# Patient Record
Sex: Female | Born: 1995 | Race: White | Hispanic: No | Marital: Married | Smoking: Never smoker
Health system: Southern US, Community
[De-identification: ages and names within clinical notes are randomized; demographics above are authoritative.]

## PROBLEM LIST (undated history)

## (undated) DIAGNOSIS — N390 Urinary tract infection, site not specified: Secondary | ICD-10-CM

## (undated) HISTORY — DX: Urinary tract infection, site not specified: N39.0

## (undated) HISTORY — PX: SPINE SURGERY: SHX786

## (undated) HISTORY — PX: OTHER SURGICAL HISTORY: SHX169

---

## 2017-09-11 ENCOUNTER — Encounter: Payer: Self-pay | Admitting: Adult Health

## 2017-10-09 ENCOUNTER — Other Ambulatory Visit: Payer: Self-pay | Admitting: Adult Health

## 2017-11-10 ENCOUNTER — Encounter: Payer: Self-pay | Admitting: Adult Health

## 2017-11-10 ENCOUNTER — Ambulatory Visit (INDEPENDENT_AMBULATORY_CARE_PROVIDER_SITE_OTHER): Payer: PRIVATE HEALTH INSURANCE | Admitting: Adult Health

## 2017-11-10 ENCOUNTER — Other Ambulatory Visit (HOSPITAL_COMMUNITY)
Admission: RE | Admit: 2017-11-10 | Discharge: 2017-11-10 | Disposition: A | Discharge status: Home / Self Care | Payer: PRIVATE HEALTH INSURANCE | Source: Ambulatory Visit | Attending: Adult Health | Admitting: Adult Health

## 2017-11-10 VITALS — BP 129/78 | HR 71 | Ht 65.0 in | Wt 156.0 lb

## 2017-11-10 DIAGNOSIS — Z01419 Encounter for gynecological examination (general) (routine) without abnormal findings: Secondary | ICD-10-CM | POA: Diagnosis not present

## 2017-11-10 NOTE — Progress Notes (Signed)
Patient ID: Angel Diaz, female   DOB: 07-Aug-1995, 22 y.o.   MRN: [id number] History of Present Illness: Angel Diaz is a 22 year old white female, married, G0P0, in for well woman gyn exam and first pap.She works at [employer].  PCP is Dr Reuel Boom.    Current Medications, Allergies, Past Medical History, Past Surgical History, Family History and Social History were reviewed in [employer] record.     Review of Systems:  Patient denies any headaches, hearing loss, fatigue, blurred vision, shortness of breath, chest pain, abdominal pain, problems with bowel movements, urination, or intercourse. No joint pain or mood swings. Periods are good, and she uses condoms, and is good with those.  Physical Exam:BP 129/78 (BP Location: Left Arm, Patient Position: Sitting, Cuff Size: Normal)   Pulse 71   Ht 5\' 5"  (1.651 m)   Wt 156 lb (70.8 kg)   LMP 10/17/2017   BMI 25.96 kg/m  General:  Well developed, well nourished, no acute distress Skin:  Warm and dry Neck:  Midline trachea, normal thyroid, good ROM, no lymphadenopathy Lungs; Clear to auscultation bilaterally Breast:  No dominant palpable mass, retraction, or nipple discharge Cardiovascular: Regular rate and rhythm Abdomen:  Soft, non tender, no hepatosplenomegaly Pelvic:  External genitalia is normal in appearance, no lesions.  The vagina is normal in appearance. Urethra has no lesions or masses. The cervix is nulliparous, pap with GC/CHL performed.  Uterus is felt to be normal size, shape, and contour.  No adnexal masses or tenderness noted.Bladder is non tender, no masses felt. Extremities/musculoskeletal:  No swelling or varicosities noted, no clubbing or cyanosis Psych:  No mood changes, alert and cooperative,seems happy PHQ 2 score 0. Examination chaperoned by Malachy Mood LPN.  Impression: 1. Well woman exam with routine gynecological exam       Plan: Take folic acid 1 mg daily  Physical in 1 year Pap in  3 if normal Get fasting labs as baseline at 24.

## 2017-11-12 LAB — CYTOLOGY - PAP
Chlamydia: NEGATIVE
DIAGNOSIS: NEGATIVE
Neisseria Gonorrhea: NEGATIVE

## 2019-05-04 ENCOUNTER — Other Ambulatory Visit (HOSPITAL_COMMUNITY): Payer: Self-pay | Admitting: Family Medicine

## 2019-05-04 ENCOUNTER — Other Ambulatory Visit: Payer: Self-pay | Admitting: Family Medicine

## 2019-05-04 DIAGNOSIS — R319 Hematuria, unspecified: Secondary | ICD-10-CM

## 2019-05-04 DIAGNOSIS — R109 Unspecified abdominal pain: Secondary | ICD-10-CM

## 2019-05-10 ENCOUNTER — Other Ambulatory Visit: Payer: Self-pay

## 2019-05-10 ENCOUNTER — Ambulatory Visit (HOSPITAL_COMMUNITY)
Admission: RE | Admit: 2019-05-10 | Discharge: 2019-05-10 | Disposition: A | Discharge status: Home / Self Care | Payer: Commercial Managed Care - PPO | Source: Ambulatory Visit | Attending: Family Medicine | Admitting: Family Medicine

## 2019-05-10 DIAGNOSIS — R109 Unspecified abdominal pain: Secondary | ICD-10-CM | POA: Diagnosis present

## 2019-05-10 DIAGNOSIS — R319 Hematuria, unspecified: Secondary | ICD-10-CM | POA: Insufficient documentation

## 2019-06-09 ENCOUNTER — Encounter: Payer: Self-pay | Admitting: Adult Health

## 2019-06-09 ENCOUNTER — Ambulatory Visit (INDEPENDENT_AMBULATORY_CARE_PROVIDER_SITE_OTHER): Payer: Commercial Managed Care - PPO | Admitting: Adult Health

## 2019-06-09 VITALS — BP 116/79 | HR 83 | Ht 65.0 in | Wt 162.5 lb

## 2019-06-09 DIAGNOSIS — N6324 Unspecified lump in the left breast, lower inner quadrant: Secondary | ICD-10-CM | POA: Insufficient documentation

## 2019-06-09 DIAGNOSIS — Z3202 Encounter for pregnancy test, result negative: Secondary | ICD-10-CM | POA: Diagnosis not present

## 2019-06-09 DIAGNOSIS — N926 Irregular menstruation, unspecified: Secondary | ICD-10-CM | POA: Insufficient documentation

## 2019-06-09 DIAGNOSIS — N644 Mastodynia: Secondary | ICD-10-CM | POA: Insufficient documentation

## 2019-06-09 LAB — POCT URINE PREGNANCY: Preg Test, Ur: NEGATIVE

## 2019-06-09 NOTE — Progress Notes (Signed)
  Subjective:     Patient ID: Angel Diaz, female   DOB: 12-09-1995, 24 y.o.   MRN: [id number]  HPI Angel Diaz is a 24 year old white female,married, G0P0, in complaining of pain in left breast and has a spot.  She had physical with PCP last year, and still works at [employer].  PCP is Dr Reuel Boom.  Review of Systems Has left breast tenderness for about 2-3 months and now noticed skin change Periods irregular and has missed one.  Reviewed past medical,surgical, social and family history. Reviewed medications and allergies.     Objective:   Physical Exam BP 116/79 (BP Location: Left Arm, Patient Position: Sitting, Cuff Size: Normal)   Pulse 83   Ht 5\' 5"  (1.651 m)   Wt 162 lb 8 oz (73.7 kg)   LMP 04/19/2019 (Approximate)   BMI 27.04 kg/m UPT negative Fall risk is low  Skin warm and dry,  Breasts:no dominate palpable mass, retraction or nipple discharge on the right, on the left, no retraction or nipple discharge, but has 1 cm tender mass at 8 o'clock about 2 FBs from areola, and has skin change near areola.    Assessment:     1. Pregnancy examination or test, negative result  2. Breast pain, left Will get left breast 04/21/2019 6/1 at 11:40 am at [facility] Decrease caffeine  3. Mass of lower inner quadrant of left breast Will get left breat [facility] at [facility] 06/29/19 at 11:40 am  4. Irregular periods Try to keep track of and time sex,would like to be pregnant, if wants progesterone level let me know  5. Missed period UPT negative    Plan:     Follow up prn

## 2019-06-10 ENCOUNTER — Other Ambulatory Visit: Payer: Self-pay

## 2019-06-10 ENCOUNTER — Encounter (HOSPITAL_COMMUNITY): Payer: Self-pay | Admitting: Emergency Medicine

## 2019-06-10 ENCOUNTER — Emergency Department (HOSPITAL_COMMUNITY)
Admission: EM | Admit: 2019-06-10 | Discharge: 2019-06-10 | Disposition: A | Discharge status: Home / Self Care | Payer: Commercial Managed Care - PPO | Attending: Emergency Medicine | Admitting: Emergency Medicine

## 2019-06-10 DIAGNOSIS — N644 Mastodynia: Secondary | ICD-10-CM | POA: Diagnosis not present

## 2019-06-10 DIAGNOSIS — N63 Unspecified lump in unspecified breast: Secondary | ICD-10-CM | POA: Insufficient documentation

## 2019-06-10 MED ORDER — NAPROXEN 250 MG PO TABS
500.0000 mg | ORAL_TABLET | Freq: Once | ORAL | Status: AC
  Administered 2019-06-10: 500 mg via ORAL
  Filled 2019-06-10: qty 2

## 2019-06-10 NOTE — ED Triage Notes (Signed)
Patient states she was diagnosed with a cyst on her left breast yesterday.  Patient states her breast is hurting and she hasn't been able to sleep.

## 2019-06-10 NOTE — ED Provider Notes (Signed)
[facility] EMERGENCY DEPARTMENT Provider Note   CSN: [id number] Arrival date & time: 06/10/19  3299     History No chief complaint on file.   Angel Diaz is a 24 y.o. female with no significant past medical history presenting for reevaluation of left breast pain and mass.  [facility] and has been scheduled for a breast ultrasound for June 1.  She describes a several month history of left breast pain which is becoming more uncomfortable in association with a tender nodule and discoloration to the skin near the site of the nodule.  She describes a dark circle which is more prominent in low lighting and disappears in bright light at the site.  She describes pain with movement, certain positions and when her bra rubs against the site as well.  She denies nipple discharge, also denies fevers or chills.  She is not pregnant as confirmed by a urine pregnancy test completed yesterday at [facility].  She has not taking any pain relievers for her symptoms.  She is desirous of getting her ultrasound completed sooner than June 1.  HPI     Past Medical History:  Diagnosis Date  . UTI (urinary tract infection)     Patient Active Problem List   Diagnosis Date Noted  . Missed period 06/09/2019  . Irregular periods 06/09/2019  . Mass of lower inner quadrant of left breast 06/09/2019  . Breast pain, left 06/09/2019  . Pregnancy examination or test, negative result 06/09/2019    Past Surgical History:  Procedure Laterality Date  . 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 Diabetes Mother     Social History   Tobacco Use  . Smoking status: Never Smoker  . Smokeless tobacco: Never Used    Substance Use Topics  . Alcohol use: Yes    Comment: rarely  . Drug use: Never    Home Medications Prior to Admission medications   Not on File    Allergies    Sulfa antibiotics  Review of Systems   Review of Systems  Constitutional: Negative for chills and fever.  HENT: Negative.   Eyes: Negative.   Respiratory: Negative.   Cardiovascular: Negative.           Gastrointestinal: Negative.   Genitourinary: Negative.   Musculoskeletal: Negative.   Skin: Negative.  Negative for rash and wound.  Neurological: Negative.   Psychiatric/Behavioral: Negative.     Physical Exam Updated Vital Signs BP (!) 141/97 (BP Location: Left Arm)   Pulse 92   Temp 98.4 F (36.9 C) (Oral)   Resp 18   Ht 5\' 5"  (1.651 m)   Wt 72.6 kg   SpO2 97%   BMI 26.63 kg/m   Physical Exam Vitals and nursing note reviewed.  Constitutional:      Appearance: She is well-developed.  HENT:     Head: Normocephalic and atraumatic.  Eyes:  Conjunctiva/sclera: Conjunctivae normal.  Cardiovascular:     Rate and Rhythm: Normal rate.  Pulmonary:     Effort: Pulmonary effort is normal.  Chest:     Breasts:        Left: Mass and tenderness present. No swelling, bleeding, inverted nipple or nipple discharge.     Comments: Small mobile, tender nodule 8 oclock position medial to left areola.  Subtle area of darker pigmentation to skin between the nodule and the areola.  No erythema, no skin inflammation or erythema.  Abdominal:     General: Bowel sounds are normal.     Palpations: Abdomen is soft.     Tenderness: There is no abdominal tenderness.  Musculoskeletal:        General: Normal range of motion.     Cervical back: Neck supple.  Skin:    General: Skin is warm and dry.  Neurological:     Mental Status: She is alert.     ED Results / Procedures / Treatments   Labs (all labs ordered are listed, but only abnormal results are displayed) Labs Reviewed - No data to  display  EKG None  Radiology No results found.  Procedures Procedures (including critical care time)  Medications Ordered in ED Medications  naproxen (NAPROSYN) tablet 500 mg (500 mg Oral Given 06/10/19 6834)    ED Course  I have reviewed the triage vital signs and the nursing notes.  Pertinent labs & imaging results that were available during my care of the patient were reviewed by me and considered in my medical decision making (see chart for details).    MDM Rules/Calculators/A&P                      Pt advised anti inflammatory pain med.  She has naproxen 500 mg tablets, advised starting use of this, heat tx may also help with discomfort.  Suggested calling radiology to get on cancellation list to get her [place] moved up if possible,  No red flag signs for infection or cancer on exam today, but I suspect getting the [place] sooner will help ease patients concern and worry.   Final Clinical Impression(s) / ED Diagnoses Final diagnoses:  Breast pain  Breast mass in female    Rx / DC Orders ED Discharge Orders    None       Victoriano Lain 06/10/19 1962    Linwood Dibbles, MD 06/11/19 [id number]

## 2019-06-10 NOTE — Discharge Instructions (Addendum)
As discussed I recommend taking your naproxen 500 mg twice daily to help with your breast discomfort.  A gentle heating pad 20 minutes several times daily may also help reduce discomfort.  You may want to try a different type of bra which does not rub against the area of discomfort.  I also recommend calling the ultrasound department to see if they can move up your appointment, perhaps they have had a cancellation and you can have this procedure sooner.  I do not detect any worrisome findings but agree with your need of an ultrasound to confirm this.  Having this test sooner will help ease your concerns.

## 2019-06-11 ENCOUNTER — Other Ambulatory Visit: Payer: Self-pay

## 2019-06-11 ENCOUNTER — Ambulatory Visit
Admission: RE | Admit: 2019-06-11 | Discharge: 2019-06-11 | Disposition: A | Discharge status: Home / Self Care | Payer: Commercial Managed Care - PPO | Source: Ambulatory Visit | Attending: Adult Health | Admitting: Adult Health

## 2019-06-11 DIAGNOSIS — N6324 Unspecified lump in the left breast, lower inner quadrant: Secondary | ICD-10-CM

## 2019-06-11 DIAGNOSIS — N644 Mastodynia: Secondary | ICD-10-CM

## 2019-06-29 ENCOUNTER — Other Ambulatory Visit (HOSPITAL_COMMUNITY): Payer: Commercial Managed Care - PPO

## 2019-08-18 ENCOUNTER — Telehealth: Payer: Self-pay | Admitting: *Deleted

## 2019-08-18 ENCOUNTER — Telehealth: Payer: Self-pay | Admitting: Obstetrics & Gynecology

## 2019-08-18 NOTE — Telephone Encounter (Signed)
Telephoned patient at home number. Patient states had positive pregnancy test Monday and is scheduled an appointment for 7/26. Patient is not experiencing any symptoms of an ectopic pregnancy. Advised patient if starts having severe left or right lower quadrant pain or bleeding would need to be evaluated at ER. Patient voiced understanding.

## 2019-08-18 NOTE — Telephone Encounter (Signed)
Patient is concerned she has ectopic pregnancy

## 2019-08-23 ENCOUNTER — Encounter: Payer: Self-pay | Admitting: *Deleted

## 2019-08-23 ENCOUNTER — Ambulatory Visit (INDEPENDENT_AMBULATORY_CARE_PROVIDER_SITE_OTHER): Payer: Commercial Managed Care - PPO | Admitting: *Deleted

## 2019-08-23 VITALS — BP 119/75 | HR 84 | Ht 65.0 in | Wt 166.0 lb

## 2019-08-23 DIAGNOSIS — Z3201 Encounter for pregnancy test, result positive: Secondary | ICD-10-CM | POA: Diagnosis not present

## 2019-08-23 LAB — POCT URINE PREGNANCY: Preg Test, Ur: POSITIVE — AB

## 2019-08-23 NOTE — Progress Notes (Signed)
   NURSE VISIT- PREGNANCY CONFIRMATION   SUBJECTIVE:  Angel Diaz is a 24 y.o. G1P0000 female at [redacted]w[redacted]d by certain LMP of Patient's last menstrual period was 07/18/2019. Here for pregnancy confirmation.  Home pregnancy test: positive x 8  She reports nausea.  She is taking prenatal vitamins.    OBJECTIVE:  BP 119/75 (BP Location: Right Arm, Patient Position: Sitting, Cuff Size: Normal)   Pulse 84   Ht 5\' 5"  (1.651 m)   Wt 166 lb (75.3 kg)   LMP 07/18/2019   BMI 27.62 kg/m   Appears well, in no apparent distress OB History  Gravida Para Term Preterm AB Living  1 0 0 0 0 0  SAB TAB Ectopic Multiple Live Births  0 0 0 0 0    # Outcome Date GA Lbr Len/2nd Weight Sex Delivery Anes PTL Lv  1 Current             Results for orders placed or performed in visit on 08/23/19 (from the past 24 hour(s))  POCT urine pregnancy   Collection Time: 08/23/19  4:10 PM  Result Value Ref Range   Preg Test, Ur Positive (A) Negative    ASSESSMENT: Positive pregnancy test, [redacted]w[redacted]d by LMP    PLAN: Schedule for dating ultrasound in 3 weeks Prenatal vitamins: continue   Nausea medicines: not currently needed   OB packet given: Yes  [redacted]w[redacted]d  08/23/2019 4:21 PM

## 2019-08-23 NOTE — Progress Notes (Signed)
Chart reviewed for nurse visit. Agree with plan of care.  Adline Potter, NP 08/23/2019 4:24 PM

## 2019-08-26 ENCOUNTER — Telehealth: Payer: Self-pay | Admitting: *Deleted

## 2019-08-26 ENCOUNTER — Telehealth: Payer: Self-pay | Admitting: Advanced Practice Midwife

## 2019-08-26 NOTE — Telephone Encounter (Signed)
Patient states she has noticed some dark spotting when wiping after using the bathroom and very mild cramping.  Denies recent intercourse.  Discussed with Victorino Dike, NP and informed patient that she is a little to early for a dating ultrasound.  Advised to continue to monitor bleeding and cramping and if bleeding became bright red or heavier and/or cramping increased, to give our office a call.  Encouraged to push fluids, avoid lifting over 25 pounds and refrain from intercourse for 7 days after bleeding stops.    Pt also states she is experiencing nausea.  Informed medications are very limited in the first trimester.  Advised to try vitamin B6 and Unisom.  Pt agreeable to plan and will let us know if she needs anything further.

## 2019-08-26 NOTE — Telephone Encounter (Signed)
Pt is 5 weeks / 5 days having concern she would like to discuss

## 2019-09-08 ENCOUNTER — Other Ambulatory Visit: Payer: Self-pay | Admitting: Obstetrics and Gynecology

## 2019-09-08 DIAGNOSIS — O3680X0 Pregnancy with inconclusive fetal viability, not applicable or unspecified: Secondary | ICD-10-CM

## 2019-09-13 ENCOUNTER — Other Ambulatory Visit: Payer: Self-pay

## 2019-09-13 ENCOUNTER — Ambulatory Visit (INDEPENDENT_AMBULATORY_CARE_PROVIDER_SITE_OTHER): Payer: Commercial Managed Care - PPO

## 2019-09-13 DIAGNOSIS — O3680X0 Pregnancy with inconclusive fetal viability, not applicable or unspecified: Secondary | ICD-10-CM

## 2019-09-13 DIAGNOSIS — Z3A08 8 weeks gestation of pregnancy: Secondary | ICD-10-CM

## 2019-09-13 NOTE — Progress Notes (Signed)
[place] 8+1 wks,single IUP w/ys,positive fht 176 bpm,normal ovaries,crl 22.60 mm

## 2019-10-06 ENCOUNTER — Other Ambulatory Visit: Payer: Self-pay | Admitting: Obstetrics & Gynecology

## 2019-10-06 DIAGNOSIS — Z3682 Encounter for antenatal screening for nuchal translucency: Secondary | ICD-10-CM

## 2019-10-06 DIAGNOSIS — Z3400 Encounter for supervision of normal first pregnancy, unspecified trimester: Secondary | ICD-10-CM | POA: Insufficient documentation

## 2019-10-07 ENCOUNTER — Encounter: Payer: Self-pay | Admitting: Advanced Practice Midwife

## 2019-10-07 ENCOUNTER — Ambulatory Visit (INDEPENDENT_AMBULATORY_CARE_PROVIDER_SITE_OTHER): Payer: 59 | Admitting: Advanced Practice Midwife

## 2019-10-07 ENCOUNTER — Ambulatory Visit: Payer: 59 | Admitting: *Deleted

## 2019-10-07 ENCOUNTER — Ambulatory Visit (INDEPENDENT_AMBULATORY_CARE_PROVIDER_SITE_OTHER): Payer: 59

## 2019-10-07 VITALS — BP 114/78 | HR 80 | Wt 169.5 lb

## 2019-10-07 DIAGNOSIS — Z3401 Encounter for supervision of normal first pregnancy, first trimester: Secondary | ICD-10-CM

## 2019-10-07 DIAGNOSIS — Z331 Pregnant state, incidental: Secondary | ICD-10-CM | POA: Diagnosis not present

## 2019-10-07 DIAGNOSIS — Z3682 Encounter for antenatal screening for nuchal translucency: Secondary | ICD-10-CM

## 2019-10-07 DIAGNOSIS — Z1389 Encounter for screening for other disorder: Secondary | ICD-10-CM

## 2019-10-07 DIAGNOSIS — Z3A11 11 weeks gestation of pregnancy: Secondary | ICD-10-CM | POA: Diagnosis not present

## 2019-10-07 LAB — POCT URINALYSIS DIPSTICK OB
Blood, UA: NEGATIVE
Glucose, UA: NEGATIVE
Ketones, UA: NEGATIVE
Leukocytes, UA: NEGATIVE
Nitrite, UA: NEGATIVE
POC,PROTEIN,UA: NEGATIVE

## 2019-10-07 NOTE — Progress Notes (Signed)
[place] 11+4 wks,measurements c/w dates,CRL 54.92 mm,fhr 160 bpm,anterior placenta gr 0,NB present,NT 1.2 mm

## 2019-10-07 NOTE — Progress Notes (Signed)
INITIAL OBSTETRICAL VISIT Patient name: Angel Diaz MRN [id number]  Date of birth: 12-16-1995 Chief Complaint:   Initial Prenatal Visit (nt/it)  History of Present Illness:   Angel Diaz is a 24 y.o. G1P0000  female at [redacted]w[redacted]d by LMP c/w u/s at 8 weeks with an Estimated Date of Delivery: 04/23/20 being seen today for her initial obstetrical visit.   Her obstetrical history is significant for first baby.   Today she reports some nausea, improving. Declines meds .  Depression screen [facility] 2/9 10/07/2019 11/10/2017  Decreased Interest 2 0  Down, Depressed, Hopeless 1 0  PHQ - 2 Score 3 0  Altered sleeping 0 -  Tired, decreased energy 2 -  Change in appetite 1 -  Feeling bad or failure about yourself  0 -  Trouble concentrating 1 -  Moving slowly or fidgety/restless 0 -  Suicidal thoughts 0 -  PHQ-9 Score 7 -    Patient's last menstrual period was 07/18/2019. Last pap 10/19. Results were: normal Review of Systems:   Pertinent items are noted in HPI Denies cramping/contractions, leakage of fluid, vaginal bleeding, abnormal vaginal discharge w/ itching/odor/irritation, headaches, visual changes, shortness of breath, chest pain, abdominal pain, severe nausea/vomiting, or problems with urination or bowel movements unless otherwise stated above.  Pertinent History Reviewed:  Reviewed past medical,surgical, social, obstetrical and family history.  Reviewed problem list, medications and allergies. OB History  Gravida Para Term Preterm AB Living  1 0 0 0 0 0  SAB TAB Ectopic Multiple Live Births  0 0 0 0 0    # Outcome Date GA Lbr Len/2nd Weight Sex Delivery Anes PTL Lv  1 Current            Physical Assessment:   Vitals:   10/07/19 1128  BP: 114/78  Pulse: 80  Weight: 169 lb 8 oz (76.9 kg)  Body mass index is 28.21 kg/m.       Physical Examination:  General appearance - well appearing, and in no distress  Mental status - alert, oriented to person, place, and  time  Psych:  She has a normal mood and affect  Skin - warm and dry, normal color, no suspicious lesions noted  Chest - effort normal  Heart - normal rate and regular rhythm  Abdomen - soft, nontender  Extremities:  No swelling or varicosities noted   TODAY'S NT [place] 11+4 wks,measurements c/w dates,CRL 54.92 mm,fhr 160 bpm,anterior placenta gr 0,NB present,NT 1.2 mm  Results for orders placed or performed in visit on 10/07/19 (from the past 24 hour(s))  POC Urinalysis Dipstick OB   Collection Time: 10/07/19 11:59 AM  Result Value Ref Range   Color, UA     Clarity, UA     Glucose, UA Negative Negative   Bilirubin, UA     Ketones, UA neg    Spec Grav, UA     Blood, UA neg    pH, UA     POC,PROTEIN,UA Negative Negative, Trace, Small (1+), Moderate (2+), Large (3+), 4+   Urobilinogen, UA     Nitrite, UA neg    Leukocytes, UA Negative Negative   Appearance     Odor      Assessment & Plan:  1) Low-Risk Pregnancy G1P0000 at [redacted]w[redacted]d with an Estimated Date of Delivery: 04/23/20   2) Initial OB visit   Meds: No orders of the defined types were placed in this encounter.   Initial labs obtained Continue prenatal vitamins Reviewed n/v relief  measures and warning s/s to report Reviewed recommended weight gain based on pre-gravid BMI Encouraged well-balanced diet Genetic & carrier screening discussed: requests Panorama, NT/IT and Horizon 14 , declines AFP Ultrasound discussed; fetal survey: requested CCNC completed> form faxed if has or is planning to apply for medicaid The nature of [facility] [employer] with multiple MDs and other Advanced Practice Providers was explained to patient; also emphasized that fellows, residents, and students are part of our team.        Jacklyn Shell 12:17 PM

## 2019-10-07 NOTE — Patient Instructions (Addendum)
[name], I greatly value your feedback.  If you receive a survey following your visit with [place] today, we appreciate you taking the time to fill it out.  Thanks, Cathie Beams, DNP, CNM  [facility] HAS MOVED!!! It is now [facility] ([address] [place], [place] [address]) Entrance located off of [address] Free 24/7 valet parking   Nausea & Vomiting  Have saltine crackers or pretzels by your bed and eat a few bites before you raise your head out of bed in the morning  Eat small frequent meals throughout the day instead of large meals  Drink plenty of fluids throughout the day to stay hydrated, just don't drink a lot of fluids with your meals.  This can make your stomach fill up faster making you feel sick  Do not brush your teeth right after you eat  Products with real ginger are good for nausea, like ginger ale and ginger hard candy Make sure it says made with real ginger!  Sucking on sour candy like lemon heads is also good for nausea  If your prenatal vitamins make you nauseated, take them at night so you will sleep through the nausea  Sea Bands  If you feel like you need medicine for the nausea & vomiting please let us know  If you are unable to keep any fluids or food down please let us know   Constipation  Drink plenty of fluid, preferably water, throughout the day  Eat foods high in fiber such as fruits, vegetables, and grains  Exercise, such as walking, is a good way to keep your bowels regular  Drink warm fluids, especially warm prune juice, or decaf coffee  Eat a 1/2 cup of real oatmeal (not instant), 1/2 cup applesauce, and 1/2-1 cup warm prune juice every day  If needed, you may take Colace (docusate sodium) stool softener once or twice a day to help keep the stool soft.   If you still are having problems with constipation, you may take Miralax once daily as needed to help keep your bowels regular.    Home Blood Pressure Monitoring for Patients   Your provider has recommended that you check your blood pressure (BP) at least once a week at home. If you do not have a blood pressure cuff at home, one will be provided for you. Contact your provider if you have not received your monitor within 1 week.   Helpful Tips for Accurate Home Blood Pressure Checks   Don't smoke, exercise, or drink caffeine 30 minutes before checking your BP  Use the restroom before checking your BP (a full bladder can raise your pressure)  Relax in a comfortable upright chair  Feet on the ground  Left arm resting comfortably on a flat surface at the level of your heart  Legs uncrossed  Back supported  Sit quietly and don't talk  Place the cuff on your bare arm  Adjust snuggly, so that only two fingertips can fit between your skin and the top of the cuff  Check 2 readings separated by at least one minute  Keep a log of your BP readings  For a visual, please reference this diagram: http://ccnc.care/bpdiagram  Provider Name: [facility]     Phone: [phone]  Zone 1: ALL CLEAR  Continue to monitor your symptoms:   BP reading is less than 140 (top number) or less than 90 (bottom number)   No right upper stomach  pain  No headaches or seeing spots  No feeling nauseated or throwing up  No swelling in face and hands  Zone 2: CAUTION Call your doctor's office for any of the following:   BP reading is greater than 140 (top number) or greater than 90 (bottom number)   Stomach pain under your ribs in the middle or right side  Headaches or seeing spots  Feeling nauseated or throwing up  Swelling in face and hands  Zone 3: EMERGENCY  Seek immediate medical care if you have any of the following:   BP reading is greater than160 (top number) or greater than 110 (bottom number)  Severe headaches not improving with Tylenol  Serious difficulty catching your breath  Any worsening  symptoms from Zone 2    First Trimester of Pregnancy The first trimester of pregnancy is from week 1 until the end of week 12 (months 1 through 3). A week after a sperm fertilizes an egg, the egg will implant on the wall of the uterus. This embryo will begin to develop into a baby. Genes from you and your partner are forming the baby. The female genes determine whether the baby is a boy or a girl. At 6-8 weeks, the eyes and face are formed, and the heartbeat can be seen on ultrasound. At the end of 12 weeks, all the baby's organs are formed.  Now that you are pregnant, you will want to do everything you can to have a healthy baby. Two of the most important things are to get good prenatal care and to follow your health care provider's instructions. Prenatal care is all the medical care you receive before the baby's birth. This care will help prevent, find, and treat any problems during the pregnancy and childbirth. BODY CHANGES Your body goes through many changes during pregnancy. The changes vary from woman to woman.   You may gain or lose a couple of pounds at first.  You may feel sick to your stomach (nauseous) and throw up (vomit). If the vomiting is uncontrollable, call your health care provider.  You may tire easily.  You may develop headaches that can be relieved by medicines approved by your health care provider.  You may urinate more often. Painful urination may mean you have a bladder infection.  You may develop heartburn as a result of your pregnancy.  You may develop constipation because certain hormones are causing the muscles that push waste through your intestines to slow down.  You may develop hemorrhoids or swollen, bulging veins (varicose veins).  Your breasts may begin to grow larger and become tender. Your nipples may stick out more, and the tissue that surrounds them (areola) may become darker.  Your gums may bleed and may be sensitive to brushing and flossing.  Dark  spots or blotches (chloasma, mask of pregnancy) may develop on your face. This will likely fade after the baby is born.  Your menstrual periods will stop.  You may have a loss of appetite.  You may develop cravings for certain kinds of food.  You may have changes in your emotions from day to day, such as being excited to be pregnant or being concerned that something may go wrong with the pregnancy and baby.  You may have more vivid and strange dreams.  You may have changes in your hair. These can include thickening of your hair, rapid growth, and changes in texture. Some women also have hair loss during or after pregnancy, or hair that  feels dry or thin. Your hair will most likely return to normal after your baby is born. WHAT TO EXPECT AT YOUR PRENATAL VISITS During a routine prenatal visit:  You will be weighed to make sure you and the baby are growing normally.  Your blood pressure will be taken.  Your abdomen will be measured to track your baby's growth.  The fetal heartbeat will be listened to starting around week 10 or 12 of your pregnancy.  Test results from any previous visits will be discussed. Your health care provider may ask you:  How you are feeling.  If you are feeling the baby move.  If you have had any abnormal symptoms, such as leaking fluid, bleeding, severe headaches, or abdominal cramping.  If you have any questions. Other tests that may be performed during your first trimester include:  Blood tests to find your blood type and to check for the presence of any previous infections. They will also be used to check for low iron levels (anemia) and Rh antibodies. Later in the pregnancy, blood tests for diabetes will be done along with other tests if problems develop.  Urine tests to check for infections, diabetes, or protein in the urine.  An ultrasound to confirm the proper growth and development of the baby.  An amniocentesis to check for possible genetic  problems.  Fetal screens for spina bifida and Down syndrome.  You may need other tests to make sure you and the baby are doing well. HOME CARE INSTRUCTIONS  Medicines  Follow your health care provider's instructions regarding medicine use. Specific medicines may be either safe or unsafe to take during pregnancy.  Take your prenatal vitamins as directed.  If you develop constipation, try taking a stool softener if your health care provider approves. Diet  Eat regular, well-balanced meals. Choose a variety of foods, such as meat or vegetable-based protein, fish, milk and low-fat dairy products, vegetables, fruits, and whole grain breads and cereals. Your health care provider will help you determine the amount of weight gain that is right for you.  Avoid raw meat and uncooked cheese. These carry germs that can cause birth defects in the baby.  Eating four or five small meals rather than three large meals a day may help relieve nausea and vomiting. If you start to feel nauseous, eating a few soda crackers can be helpful. Drinking liquids between meals instead of during meals also seems to help nausea and vomiting.  If you develop constipation, eat more high-fiber foods, such as fresh vegetables or fruit and whole grains. Drink enough fluids to keep your urine clear or pale yellow. Activity and Exercise  Exercise only as directed by your health care provider. Exercising will help you:  Control your weight.  Stay in shape.  Be prepared for labor and delivery.  Experiencing pain or cramping in the lower abdomen or low back is a good sign that you should stop exercising. Check with your health care provider before continuing normal exercises.  Try to avoid standing for long periods of time. Move your legs often if you must stand in one place for a long time.  Avoid heavy lifting.  Wear low-heeled shoes, and practice good posture.  You may continue to have sex unless your health care  provider directs you otherwise. Relief of Pain or Discomfort  Wear a good support bra for breast tenderness.    Take warm sitz baths to soothe any pain or discomfort caused by hemorrhoids. Use hemorrhoid cream  if your health care provider approves.    Rest with your legs elevated if you have leg cramps or low back pain.  If you develop varicose veins in your legs, wear support hose. Elevate your feet for 15 minutes, 3-4 times a day. Limit salt in your diet. Prenatal Care  Schedule your prenatal visits by the twelfth week of pregnancy. They are usually scheduled monthly at first, then more often in the last 2 months before delivery.  Write down your questions. Take them to your prenatal visits.  Keep all your prenatal visits as directed by your health care provider. Safety  Wear your seat belt at all times when driving.  Make a list of emergency phone numbers, including numbers for family, friends, the hospital, and police and fire departments. General Tips  Ask your health care provider for a referral to a local prenatal education class. Begin classes no later than at the beginning of month 6 of your pregnancy.  Ask for help if you have counseling or nutritional needs during pregnancy. Your health care provider can offer advice or refer you to specialists for help with various needs.  Do not use hot tubs, steam rooms, or saunas.  Do not douche or use tampons or scented sanitary pads.  Do not cross your legs for long periods of time.  Avoid cat litter boxes and soil used by cats. These carry germs that can cause birth defects in the baby and possibly loss of the fetus by miscarriage or stillbirth.  Avoid all smoking, herbs, alcohol, and medicines not prescribed by your health care provider. Chemicals in these affect the formation and growth of the baby.  Schedule a dentist appointment. At home, brush your teeth with a soft toothbrush and be gentle when you floss. SEEK MEDICAL  CARE IF:   You have dizziness.  You have mild pelvic cramps, pelvic pressure, or nagging pain in the abdominal area.  You have persistent nausea, vomiting, or diarrhea.  You have a bad smelling vaginal discharge.  You have pain with urination.  You notice increased swelling in your face, hands, legs, or ankles. SEEK IMMEDIATE MEDICAL CARE IF:   You have a fever.  You are leaking fluid from your vagina.  You have spotting or bleeding from your vagina.  You have severe abdominal cramping or pain.  You have rapid weight gain or loss.  You vomit blood or material that looks like coffee grounds.  You are exposed to [place] measles and have never had them.  You are exposed to fifth disease or chickenpox.  You develop a severe headache.  You have shortness of breath.  You have any kind of trauma, such as from a fall or a car accident. Document Released: 01/08/2001 Document Revised: 05/31/2013 Document Reviewed: 11/24/2012 [facility] Patient Information 2015 [place], [place]. This information is not intended to replace advice given to you by your health care provider. Make sure you discuss any questions you have with your health care provider.  Coronavirus (COVID-19) Are you at risk?  Are you at risk for the Coronavirus (COVID-19)?  To be considered HIGH RISK for Coronavirus (COVID-19), you have to meet the following criteria:   Traveled to [place], [place], [place], [place] or [place]; or in the [place] to [place], [place], [place], or [place]; and have fever, cough, and shortness of breath within the last 2 weeks of travel OR  Been in close contact with a person diagnosed with COVID-19 within the last 2  weeks and have fever, cough, and shortness of breath  IF YOU DO NOT MEET THESE CRITERIA, YOU ARE CONSIDERED LOW RISK FOR COVID-19.  What to do if you are HIGH RISK for COVID-19?   If you are having a medical emergency, call 911.  Seek medical care right away.  Before you go to a doctors office, urgent care or emergency department, call ahead and tell them about your recent travel, contact with someone diagnosed with COVID-19, and your symptoms. You should receive instructions from your physicians office regarding next steps of care.   When you arrive at healthcare provider, tell the healthcare staff immediately you have returned from visiting [place], [place], [place], [place] or [place]; or traveled in the [place] to [place], [place], [place], or [place]; in the last two weeks or you have been in close contact with a person diagnosed with COVID-19 in the last 2 weeks.    Tell the health care staff about your symptoms: fever, cough and shortness of breath.  After you have been seen by a medical provider, you will be either: o Tested for (COVID-19) and discharged home on quarantine except to seek medical care if symptoms worsen, and asked to  - Stay home and avoid contact with others until you get your results (4-5 days)  - Avoid travel on public transportation if possible (such as bus, train, or airplane) or o Sent to the Emergency Department by EMS for evaluation, COVID-19 testing, and possible admission depending on your condition and test results.  What to do if you are LOW RISK for COVID-19?  Reduce your risk of any infection by using the same precautions used for avoiding the common cold or flu:   Wash your hands often with soap and warm water for at least 20 seconds.  If soap and water are not readily available, use an alcohol-based hand sanitizer with at least 60% alcohol.   If coughing or sneezing, cover your mouth and nose by coughing or sneezing into the elbow areas of your shirt or coat, into a tissue or into your sleeve (not your hands).  Avoid shaking hands with others and consider head nods or verbal greetings only.  Avoid touching your eyes, nose, or mouth with unwashed hands.   Avoid close contact with people who are  sick.  Avoid places or events with large numbers of people in one location, like concerts or sporting events.  Carefully consider travel plans you have or are making.  If you are planning any travel outside or inside the [place], visit the [facility] webpage for the latest health notices.  If you have some symptoms but not all symptoms, continue to monitor at home and seek medical attention if your symptoms worsen.  If you are having a medical emergency, call 911.   ADDITIONAL HEALTHCARE OPTIONS FOR PATIENTS  [facility] / e-Visit: [email or web address]         [facility]: [phone]  [facility]: [phone]                   [facility]: [phone]     Safe Medications in Pregnancy   Acne: Benzoyl Peroxide Salicylic Acid  Backache/Headache: Tylenol: 2 regular strength every 4 hours OR              2 Extra strength every 6 hours  Colds/Coughs/Allergies: Benadryl (alcohol free) 25 mg every 6 hours as needed Breath right strips Claritin  Cepacol throat lozenges Chloraseptic throat spray Cold-Eeze- up to three times per day Cough drops, alcohol free Flonase (by prescription only) Guaifenesin Mucinex Robitussin DM (plain only, alcohol free) Saline nasal spray/drops Sudafed (pseudoephedrine) & Actifed ** use only after [redacted] weeks gestation and if you do not have high blood pressure Tylenol Vicks Vaporub Zinc lozenges Zyrtec   Constipation: Colace Ducolax suppositories Fleet enema Glycerin suppositories Metamucil Milk of magnesia Miralax Senokot Smooth move tea  Diarrhea: Kaopectate Imodium A-D  *NO pepto Bismol  Hemorrhoids: Anusol Anusol HC Preparation H Tucks  Indigestion: Tums Maalox Mylanta Zantac  Pepcid  Insomnia: Benadryl (alcohol free) 25mg  every 6 hours as needed Tylenol PM Unisom, no Gelcaps  Leg  Cramps: Tums MagGel  Nausea/Vomiting:  Bonine Dramamine Emetrol Ginger extract Sea bands Meclizine  Nausea medication to take during pregnancy:  Unisom (doxylamine succinate 25 mg tablets) Take one tablet daily at bedtime. If symptoms are not adequately controlled, the dose can be increased to a maximum recommended dose of two tablets daily (1/2 tablet in the morning, 1/2 tablet mid-afternoon and one at bedtime). Vitamin B6 100mg  tablets. Take one tablet twice a day (up to 200 mg per day).  Skin Rashes: Aveeno products Benadryl cream or 25mg  every 6 hours as needed Calamine Lotion 1% cortisone cream  Yeast infection: Gyne-lotrimin 7 Monistat 7   **If taking multiple medications, please check labels to avoid duplicating the same active ingredients **take medication as directed on the label ** Do not exceed 4000 mg of tylenol in 24 hours **Do not take medications that contain aspirin or ibuprofen

## 2019-10-08 LAB — PMP SCREEN PROFILE (10S), URINE
Amphetamine Scrn, Ur: NEGATIVE ng/mL
BARBITURATE SCREEN URINE: NEGATIVE ng/mL
BENZODIAZEPINE SCREEN, URINE: NEGATIVE ng/mL
CANNABINOIDS UR QL SCN: NEGATIVE ng/mL
Cocaine (Metab) Scrn, Ur: NEGATIVE ng/mL
Creatinine(Crt), U: 89.1 mg/dL (ref 20.0–300.0)
Methadone Screen, Urine: NEGATIVE ng/mL
OXYCODONE+OXYMORPHONE UR QL SCN: NEGATIVE ng/mL
Opiate Scrn, Ur: NEGATIVE ng/mL
Ph of Urine: 7.1 (ref 4.5–8.9)
Phencyclidine Qn, Ur: NEGATIVE ng/mL
Propoxyphene Scrn, Ur: NEGATIVE ng/mL

## 2019-10-09 LAB — INTEGRATED 1
Crown Rump Length: 54.9 mm
Gest. Age on Collection Date: 11.9 weeks
Maternal Age at EDD: 25.6 yr
Nuchal Translucency (NT): 1.2 mm
Number of Fetuses: 1
PAPP-A Value: 1208.6 ng/mL
Weight: 170 [lb_av]

## 2019-10-09 LAB — CBC/D/PLT+RPR+RH+ABO+RUB AB...
Antibody Screen: NEGATIVE
Basophils Absolute: 0.1 10*3/uL (ref 0.0–0.2)
Basos: 1 %
EOS (ABSOLUTE): 0.1 10*3/uL (ref 0.0–0.4)
Eos: 1 %
HCV Ab: 0.1 s/co ratio (ref 0.0–0.9)
HIV Screen 4th Generation wRfx: NONREACTIVE
Hematocrit: 40.2 % (ref 34.0–46.6)
Hemoglobin: 13.4 g/dL (ref 11.1–15.9)
Hepatitis B Surface Ag: NEGATIVE
Immature Grans (Abs): 0.1 10*3/uL (ref 0.0–0.1)
Immature Granulocytes: 1 %
Lymphocytes Absolute: 2 10*3/uL (ref 0.7–3.1)
Lymphs: 20 %
MCH: 29.8 pg (ref 26.6–33.0)
MCHC: 33.3 g/dL (ref 31.5–35.7)
MCV: 89 fL (ref 79–97)
Monocytes Absolute: 0.8 10*3/uL (ref 0.1–0.9)
Monocytes: 8 %
Neutrophils Absolute: 7 10*3/uL (ref 1.4–7.0)
Neutrophils: 69 %
Platelets: 341 10*3/uL (ref 150–450)
RBC: 4.5 x10E6/uL (ref 3.77–5.28)
RDW: 12.6 % (ref 11.7–15.4)
RPR Ser Ql: NONREACTIVE
Rh Factor: POSITIVE
Rubella Antibodies, IGG: 5.79 index (ref >0.99)
WBC: 10 10*3/uL (ref 3.4–10.8)

## 2019-10-09 LAB — GC/CHLAMYDIA PROBE AMP
Chlamydia trachomatis, NAA: NEGATIVE
Neisseria Gonorrhoeae by PCR: NEGATIVE

## 2019-10-09 LAB — URINE CULTURE: Organism ID, Bacteria: NO GROWTH

## 2019-10-09 LAB — HCV INTERPRETATION

## 2019-11-02 ENCOUNTER — Encounter: Payer: Self-pay | Admitting: *Deleted

## 2019-11-02 DIAGNOSIS — Z3402 Encounter for supervision of normal first pregnancy, second trimester: Secondary | ICD-10-CM

## 2019-11-04 ENCOUNTER — Encounter: Payer: Self-pay | Admitting: Advanced Practice Midwife

## 2019-11-04 ENCOUNTER — Ambulatory Visit (INDEPENDENT_AMBULATORY_CARE_PROVIDER_SITE_OTHER): Payer: 59 | Admitting: Advanced Practice Midwife

## 2019-11-04 ENCOUNTER — Other Ambulatory Visit: Payer: Self-pay

## 2019-11-04 VITALS — BP 117/71 | HR 83 | Wt 174.5 lb

## 2019-11-04 DIAGNOSIS — Z1379 Encounter for other screening for genetic and chromosomal anomalies: Secondary | ICD-10-CM

## 2019-11-04 DIAGNOSIS — Z363 Encounter for antenatal screening for malformations: Secondary | ICD-10-CM

## 2019-11-04 DIAGNOSIS — Z3A15 15 weeks gestation of pregnancy: Secondary | ICD-10-CM

## 2019-11-04 DIAGNOSIS — Z331 Pregnant state, incidental: Secondary | ICD-10-CM

## 2019-11-04 DIAGNOSIS — Z3402 Encounter for supervision of normal first pregnancy, second trimester: Secondary | ICD-10-CM

## 2019-11-04 DIAGNOSIS — Z1389 Encounter for screening for other disorder: Secondary | ICD-10-CM

## 2019-11-04 LAB — POCT URINALYSIS DIPSTICK OB
Glucose, UA: NEGATIVE
Ketones, UA: NEGATIVE
Leukocytes, UA: NEGATIVE
Nitrite, UA: NEGATIVE
POC,PROTEIN,UA: NEGATIVE

## 2019-11-04 NOTE — Patient Instructions (Signed)
Angel Diaz, I greatly value your feedback.  If you receive a survey following your visit with [place] today, we appreciate you taking the time to fill it out.  Thanks, Cathie Beams, CNM     [facility] HAS MOVED!!! It is now [facility] at [facility] ([address], [place] [address]) Entrance located off of [address] Free 24/7 valet parking   Go to [email or web address] to register for FREE online childbirth classes    Second Trimester of Pregnancy The second trimester is from week 14 through week 27 (months 4 through 6). The second trimester is often a time when you feel your best. Your body has adjusted to being pregnant, and you begin to feel better physically. Usually, morning sickness has lessened or quit completely, you may have more energy, and you may have an increase in appetite. The second trimester is also a time when the fetus is growing rapidly. At the end of the sixth month, the fetus is about 9 inches long and weighs about 1 pounds. You will likely begin to feel the baby move (quickening) between 16 and 20 weeks of pregnancy. Body changes during your second trimester Your body continues to go through many changes during your second trimester. The changes vary from woman to woman.  Your weight will continue to increase. You will notice your lower abdomen bulging out.  You may begin to get stretch marks on your hips, abdomen, and breasts.  You may develop headaches that can be relieved by medicines. The medicines should be approved by your health care provider.  You may urinate more often because the fetus is pressing on your bladder.  You may develop or continue to have heartburn as a result of your pregnancy.  You may develop constipation because certain hormones are causing the muscles that push waste through your intestines to slow down.  You may develop hemorrhoids or swollen, bulging veins (varicose veins).  You may  have back pain. This is caused by: ? Weight gain. ? Pregnancy hormones that are relaxing the joints in your pelvis. ? A shift in weight and the muscles that support your balance.  Your breasts will continue to grow and they will continue to become tender.  Your gums may bleed and may be sensitive to brushing and flossing.  Dark spots or blotches (chloasma, mask of pregnancy) may develop on your face. This will likely fade after the baby is born.  A dark line from your belly button to the pubic area (linea nigra) may appear. This will likely fade after the baby is born.  You may have changes in your hair. These can include thickening of your hair, rapid growth, and changes in texture. Some women also have hair loss during or after pregnancy, or hair that feels dry or thin. Your hair will most likely return to normal after your baby is born.  What to expect at prenatal visits During a routine prenatal visit:  You will be weighed to make sure you and the fetus are growing normally.  Your blood pressure will be taken.  Your abdomen will be measured to track your baby's growth.  The fetal heartbeat will be listened to.  Any test results from the previous visit will be discussed.  Your health care provider may ask you:  How you are feeling.  If you are feeling the baby move.  If you have had any abnormal symptoms, such as leaking fluid, bleeding, severe headaches, or  abdominal cramping.  If you are using any tobacco products, including cigarettes, chewing tobacco, and electronic cigarettes.  If you have any questions.  Other tests that may be performed during your second trimester include:  Blood tests that check for: ? Low iron levels (anemia). ? High blood sugar that affects pregnant women (gestational diabetes) between 40 and 28 weeks. ? Rh antibodies. This is to check for a protein on red blood cells (Rh factor).  Urine tests to check for infections, diabetes, or protein  in the urine.  An ultrasound to confirm the proper growth and development of the baby.  An amniocentesis to check for possible genetic problems.  Fetal screens for spina bifida and Down syndrome.  HIV (human immunodeficiency virus) testing. Routine prenatal testing includes screening for HIV, unless you choose not to have this test.  Follow these instructions at home: Medicines  Follow your health care provider's instructions regarding medicine use. Specific medicines may be either safe or unsafe to take during pregnancy.  Take a prenatal vitamin that contains at least 600 micrograms (mcg) of folic acid.  If you develop constipation, try taking a stool softener if your health care provider approves. Eating and drinking  Eat a balanced diet that includes fresh fruits and vegetables, whole grains, good sources of protein such as meat, eggs, or tofu, and low-fat dairy. Your health care provider will help you determine the amount of weight gain that is right for you.  Avoid raw meat and uncooked cheese. These carry germs that can cause birth defects in the baby.  If you have low calcium intake from food, talk to your health care provider about whether you should take a daily calcium supplement.  Limit foods that are high in fat and processed sugars, such as fried and sweet foods.  To prevent constipation: ? Drink enough fluid to keep your urine clear or pale yellow. ? Eat foods that are high in fiber, such as fresh fruits and vegetables, whole grains, and beans. Activity  Exercise only as directed by your health care provider. Most women can continue their usual exercise routine during pregnancy. Try to exercise for 30 minutes at least 5 days a week. Stop exercising if you experience uterine contractions.  Avoid heavy lifting, wear low heel shoes, and practice good posture.  A sexual relationship may be continued unless your health care provider directs you otherwise. Relieving pain  and discomfort  Wear a good support bra to prevent discomfort from breast tenderness.  Take warm sitz baths to soothe any pain or discomfort caused by hemorrhoids. Use hemorrhoid cream if your health care provider approves.  Rest with your legs elevated if you have leg cramps or low back pain.  If you develop varicose veins, wear support hose. Elevate your feet for 15 minutes, 3-4 times a day. Limit salt in your diet. Prenatal Care  Write down your questions. Take them to your prenatal visits.  Keep all your prenatal visits as told by your health care provider. This is important. Safety  Wear your seat belt at all times when driving.  Make a list of emergency phone numbers, including numbers for family, friends, the hospital, and police and fire departments. General instructions  Ask your health care provider for a referral to a local prenatal education class. Begin classes no later than the beginning of month 6 of your pregnancy.  Ask for help if you have counseling or nutritional needs during pregnancy. Your health care provider can offer advice or  refer you to specialists for help with various needs.  Do not use hot tubs, steam rooms, or saunas.  Do not douche or use tampons or scented sanitary pads.  Do not cross your legs for long periods of time.  Avoid cat litter boxes and soil used by cats. These carry germs that can cause birth defects in the baby and possibly loss of the fetus by miscarriage or stillbirth.  Avoid all smoking, herbs, alcohol, and unprescribed drugs. Chemicals in these products can affect the formation and growth of the baby.  Do not use any products that contain nicotine or tobacco, such as cigarettes and e-cigarettes. If you need help quitting, ask your health care provider.  Visit your dentist if you have not gone yet during your pregnancy. Use a soft toothbrush to brush your teeth and be gentle when you floss. Contact a health care provider  if:  You have dizziness.  You have mild pelvic cramps, pelvic pressure, or nagging pain in the abdominal area.  You have persistent nausea, vomiting, or diarrhea.  You have a bad smelling vaginal discharge.  You have pain when you urinate. Get help right away if:  You have a fever.  You are leaking fluid from your vagina.  You have spotting or bleeding from your vagina.  You have severe abdominal cramping or pain.  You have rapid weight gain or weight loss.  You have shortness of breath with chest pain.  You notice sudden or extreme swelling of your face, hands, ankles, feet, or legs.  You have not felt your baby move in over an hour.  You have severe headaches that do not go away when you take medicine.  You have vision changes. Summary  The second trimester is from week 14 through week 27 (months 4 through 6). It is also a time when the fetus is growing rapidly.  Your body goes through many changes during pregnancy. The changes vary from woman to woman.  Avoid all smoking, herbs, alcohol, and unprescribed drugs. These chemicals affect the formation and growth your baby.  Do not use any tobacco products, such as cigarettes, chewing tobacco, and e-cigarettes. If you need help quitting, ask your health care provider.  Contact your health care provider if you have any questions. Keep all prenatal visits as told by your health care provider. This is important. This information is not intended to replace advice given to you by your health care provider. Make sure you discuss any questions you have with your health care provider.

## 2019-11-04 NOTE — Progress Notes (Signed)
   LOW-RISK PREGNANCY VISIT Patient name: Angel Diaz MRN [id number]  Date of birth: [birth year] Chief Complaint:   Routine Prenatal Visit (2nd IT)  History of Present Illness:   Angel Diaz is a 24 y.o. G52P0000 female at [redacted]w[redacted]d with an Estimated Date of Delivery: 04/23/20 being seen today for ongoing management of a low-risk pregnancy. Nausea is improving Today she reports no complaints. Contractions: Not present. Vag. Bleeding: None.   . denies leaking of fluid. Review of Systems:   Pertinent items are noted in HPI Denies abnormal vaginal discharge w/ itching/odor/irritation, headaches, visual changes, shortness of breath, chest pain, abdominal pain, severe nausea/vomiting, or problems with urination or bowel movements unless otherwise stated above. Pertinent History Reviewed:  Reviewed past medical,surgical, social, obstetrical and family history.  Reviewed problem list, medications and allergies. Physical Assessment:   Vitals:   11/04/19 1352  BP: 117/71  Pulse: 83  Weight: 174 lb 8 oz (79.2 kg)  Body mass index is 29.04 kg/m.        Physical Examination:   General appearance: Well appearing, and in no distress  Mental status: Alert, oriented to person, place, and time  Skin: Warm & dry  Cardiovascular: Normal heart rate noted  Respiratory: Normal respiratory effort, no distress  Abdomen: Soft, gravid, nontender  Pelvic: Cervical exam deferred         Extremities: Edema: Trace  Fetal Status: Fetal Heart Rate (bpm): 150        Chaperone: n/a    Results for orders placed or performed in visit on 11/04/19 (from the past 24 hour(s))  POC Urinalysis Dipstick OB   Collection Time: 11/04/19  1:53 PM  Result Value Ref Range   Color, UA     Clarity, UA     Glucose, UA Negative Negative   Bilirubin, UA     Ketones, UA neg    Spec Grav, UA     Blood, UA 1+    pH, UA     POC,PROTEIN,UA Negative Negative, Trace, Small (1+), Moderate (2+), Large (3+), 4+    Urobilinogen, UA     Nitrite, UA neg    Leukocytes, UA Negative Negative   Appearance     Odor      Assessment & Plan:  1) Low-risk pregnancy G1P0000 at [redacted]w[redacted]d with an Estimated Date of Delivery: 04/23/20     Meds: No orders of the defined types were placed in this encounter.  Labs/procedures today: 2nd IT  Plan:  Continue routine obstetrical care  Next visit: prefers will be in person for anatomy scan    Reviewed:  general obstetric precautions including but not limited to vaginal bleeding, contractions, leaking of fluid and fetal movement were reviewed in detail with the patient.  All questions were answered.     Follow-up: Return in about 4 weeks (around 12/02/2019) for LROB, [id number].  Orders Placed This Encounter  Procedures  . US OB Comp + 14 Wk  . INTEGRATED 2  . POC Urinalysis Dipstick OB   Jacklyn Shell DNP, CNM 11/04/2019 2:16 PM

## 2019-11-06 LAB — INTEGRATED 2
AFP MoM: 0.91
Alpha-Fetoprotein: 25.5 ng/mL
Crown Rump Length: 54.9 mm
DIA MoM: 1.02
DIA Value: 149.3 pg/mL
Estriol, Unconjugated: 1.21 ng/mL
Gest. Age on Collection Date: 11.9 weeks
Gestational Age: 15.9 weeks
Maternal Age at EDD: 25.6 yr
Nuchal Translucency (NT): 1.2 mm
Nuchal Translucency MoM: 0.98
Number of Fetuses: 1
PAPP-A MoM: 1.8
PAPP-A Value: 1208.6 ng/mL
Test Results:: NEGATIVE
Weight: 170 [lb_av]
Weight: 175 [lb_av]
hCG MoM: 1.13
hCG Value: 39.3 IU/mL
uE3 MoM: 1.39

## 2019-12-02 ENCOUNTER — Ambulatory Visit (INDEPENDENT_AMBULATORY_CARE_PROVIDER_SITE_OTHER): Payer: 59 | Admitting: Family Medicine

## 2019-12-02 ENCOUNTER — Ambulatory Visit (INDEPENDENT_AMBULATORY_CARE_PROVIDER_SITE_OTHER): Payer: 59

## 2019-12-02 ENCOUNTER — Other Ambulatory Visit: Payer: Self-pay

## 2019-12-02 VITALS — BP 134/70 | HR 91 | Wt 179.0 lb

## 2019-12-02 DIAGNOSIS — Z3402 Encounter for supervision of normal first pregnancy, second trimester: Secondary | ICD-10-CM

## 2019-12-02 DIAGNOSIS — Z1389 Encounter for screening for other disorder: Secondary | ICD-10-CM

## 2019-12-02 DIAGNOSIS — Z23 Encounter for immunization: Secondary | ICD-10-CM

## 2019-12-02 DIAGNOSIS — Z363 Encounter for antenatal screening for malformations: Secondary | ICD-10-CM

## 2019-12-02 DIAGNOSIS — Z331 Pregnant state, incidental: Secondary | ICD-10-CM

## 2019-12-02 DIAGNOSIS — Z3A19 19 weeks gestation of pregnancy: Secondary | ICD-10-CM

## 2019-12-02 LAB — POCT URINALYSIS DIPSTICK OB
Glucose, UA: NEGATIVE
Ketones, UA: NEGATIVE
Leukocytes, UA: NEGATIVE
Nitrite, UA: NEGATIVE
POC,PROTEIN,UA: NEGATIVE

## 2019-12-02 NOTE — Progress Notes (Signed)
[place] 19+4 wks,cephalic,anterior placenta gr 0,normal ovaries, cx 2.9 cm,svp of fluid 3.8 cm,fhr 148 bpm,EFW 386 g 98%,anatomy complete,no obvious abnormalities

## 2019-12-02 NOTE — Progress Notes (Signed)
  LOW-RISK PREGNANCY VISIT Patient name: Angel Diaz MRN [id number]  Date of birth: 06-Apr-1995 Chief Complaint:   Routine Prenatal Visit  History of Present Illness:   Angel Diaz is a 24 y.o. G29P0000 female at [redacted]w[redacted]d with an Estimated Date of Delivery: 04/23/20 being seen today for ongoing management of a low-risk pregnancy.  Today she reports no complaints. Contractions: Not present. Vag. Bleeding: None.   . denies leaking of fluid. Review of Systems:   Pertinent items are noted in HPI Denies abnormal vaginal discharge w/ itching/odor/irritation, headaches, visual changes, shortness of breath, chest pain, abdominal pain, severe nausea/vomiting, or problems with urination or bowel movements unless otherwise stated above. Pertinent History Reviewed:  Reviewed past medical,surgical, social, obstetrical and family history.  Reviewed problem list, medications and allergies. Physical Assessment:   Vitals:   12/02/19 1054  BP: 134/70  Pulse: 91  Weight: 179 lb (81.2 kg)  Body mass index is 29.79 kg/m.        Physical Examination:   General appearance: Well appearing, and in no distress  Mental status: Alert, oriented to person, place, and time  Skin: Warm & dry  Cardiovascular: Normal heart rate noted  Respiratory: Normal respiratory effort, no distress  Abdomen: Soft, gravid, nontender  Pelvic: Cervical exam deferred         Extremities: Edema: Trace  Fetal Status: Fetal Heart Rate (bpm): 148        Results for orders placed or performed in visit on 12/02/19 (from the past 24 hour(s))  POC Urinalysis Dipstick OB   Collection Time: 12/02/19 10:58 AM  Result Value Ref Range   Color, UA     Clarity, UA     Glucose, UA Negative Negative   Bilirubin, UA     Ketones, UA neg    Spec Grav, UA     Blood, UA trace    pH, UA     POC,PROTEIN,UA Negative Negative, Trace, Small (1+), Moderate (2+), Large (3+), 4+   Urobilinogen, UA     Nitrite, UA neg    Leukocytes,  UA Negative Negative   Appearance     Odor      Assessment & Plan:  1) Low-risk pregnancy G1P0000 at [redacted]w[redacted]d with an Estimated Date of Delivery: 04/23/20  Anatomy scan completed today  2) Routine immunization, patient has received her flu and covid shots already   Plan:  Continue routine obstetrical care :           RTC in 4 weeks   Meds: No orders of the defined types were placed in this encounter.  Labs/procedures today: anatomy scan   Reviewed: Preterm labor symptoms and general obstetric precautions including but not limited to vaginal bleeding, contractions, leaking of fluid and fetal movement were reviewed in detail with the patient.  All questions were answered  Follow-up: Return in 4 weeks (on 12/30/2019) for ob visit.  Orders Placed This Encounter  Procedures  . Flu Vaccine QUAD 36+ mos IM  . POC Urinalysis Dipstick OB   Venora Maples, MD/MPH 12/03/2019

## 2019-12-02 NOTE — Patient Instructions (Signed)
 Contraception Choices Contraception, also called birth control, refers to methods or devices that prevent pregnancy. Hormonal methods Contraceptive implant  A contraceptive implant is a thin, plastic tube that contains a hormone. It is inserted into the upper part of the arm. It can remain in place for up to 3 years. Progestin-only injections Progestin-only injections are injections of progestin, a synthetic form of the hormone progesterone. They are given every 3 months by a health care provider. Birth control pills  Birth control pills are pills that contain hormones that prevent pregnancy. They must be taken once a day, preferably at the same time each day. Birth control patch  The birth control patch contains hormones that prevent pregnancy. It is placed on the skin and must be changed once a week for three weeks and removed on the fourth week. A prescription is needed to use this method of contraception. Vaginal ring  A vaginal ring contains hormones that prevent pregnancy. It is placed in the vagina for three weeks and removed on the fourth week. After that, the process is repeated with a new ring. A prescription is needed to use this method of contraception. Emergency contraceptive Emergency contraceptives prevent pregnancy after unprotected sex. They come in pill form and can be taken up to 5 days after sex. They work best the sooner they are taken after having sex. Most emergency contraceptives are available without a prescription. This method should not be used as your only form of birth control. Barrier methods Female condom  A female condom is a thin sheath that is worn over the penis during sex. Condoms keep sperm from going inside a woman's body. They can be used with a spermicide to increase their effectiveness. They should be disposed after a single use. Female condom  A female condom is a soft, loose-fitting sheath that is put into the vagina before sex. The condom keeps  sperm from going inside a woman's body. They should be disposed after a single use. Diaphragm  A diaphragm is a soft, dome-shaped barrier. It is inserted into the vagina before sex, along with a spermicide. The diaphragm blocks sperm from entering the uterus, and the spermicide kills sperm. A diaphragm should be left in the vagina for 6-8 hours after sex and removed within 24 hours. A diaphragm is prescribed and fitted by a health care provider. A diaphragm should be replaced every 1-2 years, after giving birth, after gaining more than 15 lb (6.8 kg), and after pelvic surgery. Cervical cap  A cervical cap is a round, soft latex or plastic cup that fits over the cervix. It is inserted into the vagina before sex, along with spermicide. It blocks sperm from entering the uterus. The cap should be left in place for 6-8 hours after sex and removed within 48 hours. A cervical cap must be prescribed and fitted by a health care provider. It should be replaced every 2 years. Sponge  A sponge is a soft, circular piece of polyurethane foam with spermicide on it. The sponge helps block sperm from entering the uterus, and the spermicide kills sperm. To use it, you make it wet and then insert it into the vagina. It should be inserted before sex, left in for at least 6 hours after sex, and removed and thrown away within 30 hours. Spermicides Spermicides are chemicals that kill or block sperm from entering the cervix and uterus. They can come as a cream, jelly, suppository, foam, or tablet. A spermicide should be inserted into   the vagina with an applicator at least 10-15 minutes before sex to allow time for it to work. The process must be repeated every time you have sex. Spermicides do not require a prescription. Intrauterine contraception Intrauterine device (IUD) An IUD is a T-shaped device that is put in a woman's uterus. There are two types:  Hormone IUD.This type contains progestin, a synthetic form of the  hormone progesterone. This type can stay in place for 3-5 years.  Copper IUD.This type is wrapped in copper wire. It can stay in place for 10 years.  Permanent methods of contraception Female tubal ligation In this method, a woman's fallopian tubes are sealed, tied, or blocked during surgery to prevent eggs from traveling to the uterus. Hysteroscopic sterilization In this method, a small, flexible insert is placed into each fallopian tube. The inserts cause scar tissue to form in the fallopian tubes and block them, so sperm cannot reach an egg. The procedure takes about 3 months to be effective. Another form of birth control must be used during those 3 months. Female sterilization This is a procedure to tie off the tubes that carry sperm (vasectomy). After the procedure, the man can still ejaculate fluid (semen). Natural planning methods Natural family planning In this method, a couple does not have sex on days when the woman could become pregnant. Calendar method This means keeping track of the length of each menstrual cycle, identifying the days when pregnancy can happen, and not having sex on those days. Ovulation method In this method, a couple avoids sex during ovulation. Symptothermal method This method involves not having sex during ovulation. The woman typically checks for ovulation by watching changes in her temperature and in the consistency of cervical mucus. Post-ovulation method In this method, a couple waits to have sex until after ovulation. Summary  Contraception, also called birth control, means methods or devices that prevent pregnancy.  Hormonal methods of contraception include implants, injections, pills, patches, vaginal rings, and emergency contraceptives.  Barrier methods of contraception can include female condoms, female condoms, diaphragms, cervical caps, sponges, and spermicides.  There are two types of IUDs (intrauterine devices). An IUD can be put in a woman's  uterus to prevent pregnancy for 3-5 years.  Permanent sterilization can be done through a procedure for males, females, or both.  Natural family planning methods involve not having sex on days when the woman could become pregnant. This information is not intended to replace advice given to you by your health care provider. Make sure you discuss any questions you have with your health care provider. Document Revised: 01/16/2017 Document Reviewed: 02/17/2016 Elsevier Patient Education  2020 [employer]   Breastfeeding  Choosing to breastfeed is one of the best decisions you can make for yourself and your baby. A change in hormones during pregnancy causes your breasts to make breast milk in your milk-producing glands. Hormones prevent breast milk from being released before your baby is born. They also prompt milk flow after birth. Once breastfeeding has begun, thoughts of your baby, as well as his or her sucking or crying, can stimulate the release of milk from your milk-producing glands. Benefits of breastfeeding Research shows that breastfeeding offers many health benefits for infants and mothers. It also offers a cost-free and convenient way to feed your baby. For your baby  Your first milk (colostrum) helps your baby's digestive system to function better.  Special cells in your milk (antibodies) help your baby to fight off infections.  Breastfed babies are   less likely to develop asthma, allergies, obesity, or type 2 diabetes. They are also at lower risk for sudden infant death syndrome (SIDS).  Nutrients in breast milk are better able to meet your baby's needs compared to infant formula.  Breast milk improves your baby's brain development. For you  Breastfeeding helps to create a very special bond between you and your baby.  Breastfeeding is convenient. Breast milk costs nothing and is always available at the correct temperature.  Breastfeeding helps to burn calories. It helps you  to lose the weight that you gained during pregnancy.  Breastfeeding makes your uterus return faster to its size before pregnancy. It also slows bleeding (lochia) after you give birth.  Breastfeeding helps to lower your risk of developing type 2 diabetes, osteoporosis, rheumatoid arthritis, cardiovascular disease, and breast, ovarian, uterine, and endometrial cancer later in life. Breastfeeding basics Starting breastfeeding  Find a comfortable place to sit or lie down, with your neck and back well-supported.  Place a pillow or a rolled-up blanket under your baby to bring him or her to the level of your breast (if you are seated). Nursing pillows are specially designed to help support your arms and your baby while you breastfeed.  Make sure that your baby's tummy (abdomen) is facing your abdomen.  Gently massage your breast. With your fingertips, massage from the outer edges of your breast inward toward the nipple. This encourages milk flow. If your milk flows slowly, you may need to continue this action during the feeding.  Support your breast with 4 fingers underneath and your thumb above your nipple (make the letter "C" with your hand). Make sure your fingers are well away from your nipple and your baby's mouth.  Stroke your baby's lips gently with your finger or nipple.  When your baby's mouth is open wide enough, quickly bring your baby to your breast, placing your entire nipple and as much of the areola as possible into your baby's mouth. The areola is the colored area around your nipple. ? More areola should be visible above your baby's upper lip than below the lower lip. ? Your baby's lips should be opened and extended outward (flanged) to ensure an adequate, comfortable latch. ? Your baby's tongue should be between his or her lower gum and your breast.  Make sure that your baby's mouth is correctly positioned around your nipple (latched). Your baby's lips should create a seal on your  breast and be turned out (everted).  It is common for your baby to suck about 2-3 minutes in order to start the flow of breast milk. Latching Teaching your baby how to latch onto your breast properly is very important. An improper latch can cause nipple pain, decreased milk supply, and poor weight gain in your baby. Also, if your baby is not latched onto your nipple properly, he or she may swallow some air during feeding. This can make your baby fussy. Burping your baby when you switch breasts during the feeding can help to get rid of the air. However, teaching your baby to latch on properly is still the best way to prevent fussiness from swallowing air while breastfeeding. Signs that your baby has successfully latched onto your nipple  Silent tugging or silent sucking, without causing you pain. Infant's lips should be extended outward (flanged).  Swallowing heard between every 3-4 sucks once your milk has started to flow (after your let-down milk reflex occurs).  Muscle movement above and in front of his or her   ears while sucking. Signs that your baby has not successfully latched onto your nipple  Sucking sounds or smacking sounds from your baby while breastfeeding.  Nipple pain. If you think your baby has not latched on correctly, slip your finger into the corner of your baby's mouth to break the suction and place it between your baby's gums. Attempt to start breastfeeding again. Signs of successful breastfeeding Signs from your baby  Your baby will gradually decrease the number of sucks or will completely stop sucking.  Your baby will fall asleep.  Your baby's body will relax.  Your baby will retain a small amount of milk in his or her mouth.  Your baby will let go of your breast by himself or herself. Signs from you  Breasts that have increased in firmness, weight, and size 1-3 hours after feeding.  Breasts that are softer immediately after breastfeeding.  Increased milk  volume, as well as a change in milk consistency and color by the fifth day of breastfeeding.  Nipples that are not sore, cracked, or bleeding. Signs that your baby is getting enough milk  Wetting at least 1-2 diapers during the first 24 hours after birth.  Wetting at least 5-6 diapers every 24 hours for the first week after birth. The urine should be clear or pale yellow by the age of 5 days.  Wetting 6-8 diapers every 24 hours as your baby continues to grow and develop.  At least 3 stools in a 24-hour period by the age of 5 days. The stool should be soft and yellow.  At least 3 stools in a 24-hour period by the age of 7 days. The stool should be seedy and yellow.  No loss of weight greater than 10% of birth weight during the first 3 days of life.  Average weight gain of 4-7 oz (113-198 g) per week after the age of 4 days.  Consistent daily weight gain by the age of 5 days, without weight loss after the age of 2 weeks. After a feeding, your baby may spit up a small amount of milk. This is normal. Breastfeeding frequency and duration Frequent feeding will help you make more milk and can prevent sore nipples and extremely full breasts (breast engorgement). Breastfeed when you feel the need to reduce the fullness of your breasts or when your baby shows signs of hunger. This is called "breastfeeding on demand." Signs that your baby is hungry include:  Increased alertness, activity, or restlessness.  Movement of the head from side to side.  Opening of the mouth when the corner of the mouth or cheek is stroked (rooting).  Increased sucking sounds, smacking lips, cooing, sighing, or squeaking.  Hand-to-mouth movements and sucking on fingers or hands.  Fussing or crying. Avoid introducing a pacifier to your baby in the first 4-6 weeks after your baby is born. After this time, you may choose to use a pacifier. Research has shown that pacifier use during the first year of a baby's life  decreases the risk of sudden infant death syndrome (SIDS). Allow your baby to feed on each breast as long as he or she wants. When your baby unlatches or falls asleep while feeding from the first breast, offer the second breast. Because newborns are often sleepy in the first few weeks of life, you may need to awaken your baby to get him or her to feed. Breastfeeding times will vary from baby to baby. However, the following rules can serve as a guide to   help you make sure that your baby is properly fed:  Newborns (babies 4 weeks of age or younger) may breastfeed every 1-3 hours.  Newborns should not go without breastfeeding for longer than 3 hours during the day or 5 hours during the night.  You should breastfeed your baby a minimum of 8 times in a 24-hour period. Breast milk pumping     Pumping and storing breast milk allows you to make sure that your baby is exclusively fed your breast milk, even at times when you are unable to breastfeed. This is especially important if you go back to work while you are still breastfeeding, or if you are not able to be present during feedings. Your lactation consultant can help you find a method of pumping that works best for you and give you guidelines about how long it is safe to store breast milk. Caring for your breasts while you breastfeed Nipples can become dry, cracked, and sore while breastfeeding. The following recommendations can help keep your breasts moisturized and healthy:  Avoid using soap on your nipples.  Wear a supportive bra designed especially for nursing. Avoid wearing underwire-style bras or extremely tight bras (sports bras).  Air-dry your nipples for 3-4 minutes after each feeding.  Use only cotton bra pads to absorb leaked breast milk. Leaking of breast milk between feedings is normal.  Use lanolin on your nipples after breastfeeding. Lanolin helps to maintain your skin's normal moisture barrier. Pure lanolin is not harmful (not  toxic) to your baby. You may also hand express a few drops of breast milk and gently massage that milk into your nipples and allow the milk to air-dry. In the first few weeks after giving birth, some women experience breast engorgement. Engorgement can make your breasts feel heavy, warm, and tender to the touch. Engorgement peaks within 3-5 days after you give birth. The following recommendations can help to ease engorgement:  Completely empty your breasts while breastfeeding or pumping. You may want to start by applying warm, moist heat (in the shower or with warm, water-soaked hand towels) just before feeding or pumping. This increases circulation and helps the milk flow. If your baby does not completely empty your breasts while breastfeeding, pump any extra milk after he or she is finished.  Apply ice packs to your breasts immediately after breastfeeding or pumping, unless this is too uncomfortable for you. To do this: ? Put ice in a plastic bag. ? Place a towel between your skin and the bag. ? Leave the ice on for 20 minutes, 2-3 times a day.  Make sure that your baby is latched on and positioned properly while breastfeeding. If engorgement persists after 48 hours of following these recommendations, contact your health care provider or a lactation consultant. Overall health care recommendations while breastfeeding  Eat 3 healthy meals and 3 snacks every day. Well-nourished mothers who are breastfeeding need an additional 450-500 calories a day. You can meet this requirement by increasing the amount of a balanced diet that you eat.  Drink enough water to keep your urine pale yellow or clear.  Rest often, relax, and continue to take your prenatal vitamins to prevent fatigue, stress, and low vitamin and mineral levels in your body (nutrient deficiencies).  Do not use any products that contain nicotine or tobacco, such as cigarettes and e-cigarettes. Your baby may be harmed by chemicals from  cigarettes that pass into breast milk and exposure to secondhand smoke. If you need help quitting, ask your   health care provider.  Avoid alcohol.  Do not use illegal drugs or marijuana.  Talk with your health care provider before taking any medicines. These include over-the-counter and prescription medicines as well as vitamins and herbal supplements. Some medicines that may be harmful to your baby can pass through breast milk.  It is possible to become pregnant while breastfeeding. If birth control is desired, ask your health care provider about options that will be safe while breastfeeding your baby. Where to find more information: [employer]: www.llli.org Contact a health care provider if:  You feel like you want to stop breastfeeding or have become frustrated with breastfeeding.  Your nipples are cracked or bleeding.  Your breasts are red, tender, or warm.  You have: ? Painful breasts or nipples. ? A swollen area on either breast. ? A fever or chills. ? Nausea or vomiting. ? Drainage other than breast milk from your nipples.  Your breasts do not become full before feedings by the fifth day after you give birth.  You feel sad and depressed.  Your baby is: ? Too sleepy to eat well. ? Having trouble sleeping. ? More than 1 week old and wetting fewer than 6 diapers in a 24-hour period. ? Not gaining weight by 5 days of age.  Your baby has fewer than 3 stools in a 24-hour period.  Your baby's skin or the white parts of his or her eyes become yellow. Get help right away if:  Your baby is overly tired (lethargic) and does not want to wake up and feed.  Your baby develops an unexplained fever. Summary  Breastfeeding offers many health benefits for infant and mothers.  Try to breastfeed your infant when he or she shows early signs of hunger.  Gently tickle or stroke your baby's lips with your finger or nipple to allow the baby to open his or her mouth.  Bring the baby to your breast. Make sure that much of the areola is in your baby's mouth. Offer one side and burp the baby before you offer the other side.  Talk with your health care provider or lactation consultant if you have questions or you face problems as you breastfeed. This information is not intended to replace advice given to you by your health care provider. Make sure you discuss any questions you have with your health care provider. Document Revised: 04/10/2017 Document Reviewed: 02/16/2016 Elsevier Patient Education  2020 [employer] Inc.  

## 2019-12-03 ENCOUNTER — Encounter: Payer: Self-pay | Admitting: Family Medicine

## 2019-12-30 ENCOUNTER — Other Ambulatory Visit: Payer: Self-pay

## 2019-12-30 ENCOUNTER — Ambulatory Visit (INDEPENDENT_AMBULATORY_CARE_PROVIDER_SITE_OTHER): Payer: 59 | Admitting: Advanced Practice Midwife

## 2019-12-30 VITALS — BP 135/69 | HR 102 | Wt 186.0 lb

## 2019-12-30 DIAGNOSIS — Z1389 Encounter for screening for other disorder: Secondary | ICD-10-CM

## 2019-12-30 DIAGNOSIS — Z331 Pregnant state, incidental: Secondary | ICD-10-CM

## 2019-12-30 DIAGNOSIS — Z3A23 23 weeks gestation of pregnancy: Secondary | ICD-10-CM

## 2019-12-30 DIAGNOSIS — Z3402 Encounter for supervision of normal first pregnancy, second trimester: Secondary | ICD-10-CM

## 2019-12-30 LAB — POCT URINALYSIS DIPSTICK OB
Blood, UA: NEGATIVE
Glucose, UA: NEGATIVE
Ketones, UA: NEGATIVE
Leukocytes, UA: NEGATIVE
Nitrite, UA: NEGATIVE
POC,PROTEIN,UA: NEGATIVE

## 2019-12-30 NOTE — Progress Notes (Signed)
   LOW-RISK PREGNANCY VISIT Patient name: Angel Diaz MRN [id number]  Date of birth: 12-08-1995 Chief Complaint:   Routine Prenatal Visit  History of Present Illness:   Stevee Valenta is a 24 y.o. G64P0000 female at [redacted]w[redacted]d with an Estimated Date of Delivery: 04/23/20 being seen today for ongoing management of a low-risk pregnancy.  Today she reports some tailbone pain. Contractions: Not present. Vag. Bleeding: None.   . denies leaking of fluid. Review of Systems:   Pertinent items are noted in HPI Denies abnormal vaginal discharge w/ itching/odor/irritation, headaches, visual changes, shortness of breath, chest pain, abdominal pain, severe nausea/vomiting, or problems with urination or bowel movements unless otherwise stated above. Pertinent History Reviewed:  Reviewed past medical,surgical, social, obstetrical and family history.  Reviewed problem list, medications and allergies. Physical Assessment:   Vitals:   12/30/19 0903  BP: 135/69  Pulse: (!) 102  Weight: 186 lb (84.4 kg)  Body mass index is 30.95 kg/m.        Physical Examination:   General appearance: Well appearing, and in no distress  Mental status: Alert, oriented to person, place, and time  Skin: Warm & dry  Cardiovascular: Normal heart rate noted  Respiratory: Normal respiratory effort, no distress  Abdomen: Soft, gravid, nontender  Pelvic: Cervical exam deferred         Extremities: Edema: Trace  Fetal Status: Fetal Heart Rate (bpm): 152 Fundal Height: 24 cm      Chaperone: n/a    Results for orders placed or performed in visit on 12/30/19 (from the past 24 hour(s))  POC Urinalysis Dipstick OB   Collection Time: 12/30/19  9:07 AM  Result Value Ref Range   Color, UA     Clarity, UA     Glucose, UA Negative Negative   Bilirubin, UA     Ketones, UA neg    Spec Grav, UA     Blood, UA neg    pH, UA     POC,PROTEIN,UA Negative Negative, Trace, Small (1+), Moderate (2+), Large (3+), 4+    Urobilinogen, UA     Nitrite, UA neg    Leukocytes, UA Negative Negative   Appearance     Odor      Assessment & Plan:  1) Low-risk pregnancy G1P0000 at [redacted]w[redacted]d with an Estimated Date of Delivery: 04/23/20     Meds: No orders of the defined types were placed in this encounter.  Labs/procedures today: none  Plan:  Continue routine obstetrical care  Next visit: prefers will be in person for PN2    Reviewed: Preterm labor symptoms and general obstetric precautions including but not limited to vaginal bleeding, contractions, leaking of fluid and fetal movement were reviewed in detail with the patient.  All questions were answered. Given home bp cuff. Check bp weekly, let us know if >140/90.   Follow-up: Return in about 4 weeks (around 01/27/2020) for PN2/LROB.  Orders Placed This Encounter  Procedures  . POC Urinalysis Dipstick OB   Jacklyn Shell DNP, CNM 12/30/2019 9:29 AM

## 2019-12-30 NOTE — Patient Instructions (Signed)

## 2020-01-27 ENCOUNTER — Other Ambulatory Visit: Payer: Self-pay

## 2020-01-27 ENCOUNTER — Other Ambulatory Visit: Payer: 59

## 2020-01-27 ENCOUNTER — Ambulatory Visit (INDEPENDENT_AMBULATORY_CARE_PROVIDER_SITE_OTHER): Payer: 59 | Admitting: Advanced Practice Midwife

## 2020-01-27 VITALS — BP 125/74 | HR 98 | Wt 192.0 lb

## 2020-01-27 DIAGNOSIS — Z3402 Encounter for supervision of normal first pregnancy, second trimester: Secondary | ICD-10-CM

## 2020-01-27 DIAGNOSIS — Z331 Pregnant state, incidental: Secondary | ICD-10-CM

## 2020-01-27 DIAGNOSIS — Z3A27 27 weeks gestation of pregnancy: Secondary | ICD-10-CM

## 2020-01-27 DIAGNOSIS — Z3403 Encounter for supervision of normal first pregnancy, third trimester: Secondary | ICD-10-CM

## 2020-01-27 DIAGNOSIS — Z1389 Encounter for screening for other disorder: Secondary | ICD-10-CM

## 2020-01-27 LAB — POCT URINALYSIS DIPSTICK OB
Blood, UA: NEGATIVE
Glucose, UA: NEGATIVE
Ketones, UA: NEGATIVE
Leukocytes, UA: NEGATIVE
Nitrite, UA: NEGATIVE
POC,PROTEIN,UA: NEGATIVE

## 2020-01-27 NOTE — Progress Notes (Signed)
   LOW-RISK PREGNANCY VISIT Patient name: Angel Diaz MRN [id number]  Date of birth: 1995-08-16 Chief Complaint:   Routine Prenatal Visit  History of Present Illness:   Angel Diaz is a 24 y.o. G31P0000 female at [redacted]w[redacted]d with an Estimated Date of Delivery: 04/23/20 being seen today for ongoing management of a low-risk pregnancy.  Today she reports no complaints. Contractions: Not present. Vag. Bleeding: None.  Movement: Present. denies leaking of fluid. Review of Systems:   Pertinent items are noted in HPI Denies abnormal vaginal discharge w/ itching/odor/irritation, headaches, visual changes, shortness of breath, chest pain, abdominal pain, severe nausea/vomiting, or problems with urination or bowel movements unless otherwise stated above. Pertinent History Reviewed:  Reviewed past medical,surgical, social, obstetrical and family history.  Reviewed problem list, medications and allergies. Physical Assessment:   Vitals:   01/27/20 0850  BP: 125/74  Pulse: 98  Weight: 192 lb (87.1 kg)  Body mass index is 31.95 kg/m.        Physical Examination:   General appearance: Well appearing, and in no distress  Mental status: Alert, oriented to person, place, and time  Skin: Warm & dry  Cardiovascular: Normal heart rate noted  Respiratory: Normal respiratory effort, no distress  Abdomen: Soft, gravid, nontender  Pelvic: Cervical exam deferred         Extremities: Edema: Trace  Fetal Status: Fetal Heart Rate (bpm): 154 Fundal Height: 29 cm Movement: Present    Chaperone: n/a    Results for orders placed or performed in visit on 01/27/20 (from the past 24 hour(s))  POC Urinalysis Dipstick OB   Collection Time: 01/27/20  8:50 AM  Result Value Ref Range   Color, UA     Clarity, UA     Glucose, UA Negative Negative   Bilirubin, UA     Ketones, UA neg    Spec Grav, UA     Blood, UA neg    pH, UA     POC,PROTEIN,UA Negative Negative, Trace, Small (1+), Moderate (2+),  Large (3+), 4+   Urobilinogen, UA     Nitrite, UA neg    Leukocytes, UA Negative Negative   Appearance     Odor      Assessment & Plan:  1) Low-risk pregnancy G1P0000 at [redacted]w[redacted]d with an Estimated Date of Delivery: 04/23/20     Meds: No orders of the defined types were placed in this encounter.  Labs/procedures today: PN2  Plan:  Continue routine obstetrical care  Next visit: prefers online    Reviewed: Preterm labor symptoms and general obstetric precautions including but not limited to vaginal bleeding, contractions, leaking of fluid and fetal movement were reviewed in detail with the patient.  All questions were answered. Has home bp cuff. Check bp weekly, let us know if >140/90.   Follow-up: Return in about 3 weeks (around 02/17/2020) for [facility] My[facility] visit.  Orders Placed This Encounter  Procedures  . POC Urinalysis Dipstick OB   Jacklyn Shell DNP, CNM 01/27/2020 9:16 AM

## 2020-01-27 NOTE — Patient Instructions (Addendum)
Angel Diaz, I greatly value your feedback.  If you receive a survey following your visit with [place] today, we appreciate you taking the time to fill it out.  Thanks, Cathie Beams, CNM   [facility] HAS MOVED!!! It is now [facility] ([address] [place], [place] [address]) Entrance located off of [address] Free 24/7 valet parking   Go to [email or web address] to register for FREE online childbirth classes    Call the office [phone]) or go to [facility] if:  You begin to have strong, frequent contractions  Your water breaks.  Sometimes it is a big gush of fluid, sometimes it is just a trickle that keeps getting your panties wet or running down your legs  You have vaginal bleeding.  It is normal to have a small amount of spotting if your cervix was checked.   You don't feel your baby moving like normal.  If you don't, get you something to eat and drink and lay down and focus on feeling your baby move.  You should feel at least 10 movements in 2 hours.  If you don't, you should call the office or go to [facility].    Tdap Vaccine  It is recommended that you get the Tdap vaccine during the third trimester of EACH pregnancy to help protect your baby from getting pertussis (whooping cough)  27-36 weeks is the BEST time to do this so that you can pass the protection on to your baby. During pregnancy is better than after pregnancy, but if you are unable to get it during pregnancy it will be offered at the hospital.   You will be offered this vaccine in the office after 27 weeks. If you do not have health insurance, you can get this vaccine at the health department or your family doctor  Everyone who will be around your baby should also be up-to-date on their vaccines. Adults (who are not pregnant) only need 1 dose of Tdap during adulthood.   Third Trimester of Pregnancy The third trimester is from week 29 through week 42, months 7  through 9. The third trimester is a time when the fetus is growing rapidly. At the end of the ninth month, the fetus is about 20 inches in length and weighs 6-10 pounds.  BODY CHANGES Your body goes through many changes during pregnancy. The changes vary from woman to woman.   Your weight will continue to increase. You can expect to gain 25-35 pounds (11-16 kg) by the end of the pregnancy.  You may begin to get stretch marks on your hips, abdomen, and breasts.  You may urinate more often because the fetus is moving lower into your pelvis and pressing on your bladder.  You may develop or continue to have heartburn as a result of your pregnancy.  You may develop constipation because certain hormones are causing the muscles that push waste through your intestines to slow down.  You may develop hemorrhoids or swollen, bulging veins (varicose veins).  You may have pelvic pain because of the weight gain and pregnancy hormones relaxing your joints between the bones in your pelvis. Backaches may result from overexertion of the muscles supporting your posture.  You may have changes in your hair. These can include thickening of your hair, rapid growth, and changes in texture. Some women also have hair loss during or after pregnancy, or hair that feels dry or thin. Your hair will most likely return to  normal after your baby is born.  Your breasts will continue to grow and be tender. A yellow discharge may leak from your breasts called colostrum.  Your belly button may stick out.  You may feel short of breath because of your expanding uterus.  You may notice the fetus "dropping," or moving lower in your abdomen.  You may have a bloody mucus discharge. This usually occurs a few days to a week before labor begins.  Your cervix becomes thin and soft (effaced) near your due date. WHAT TO EXPECT AT YOUR PRENATAL EXAMS  You will have prenatal exams every 2 weeks until week 36. Then, you will have  weekly prenatal exams. During a routine prenatal visit:  You will be weighed to make sure you and the fetus are growing normally.  Your blood pressure is taken.  Your abdomen will be measured to track your baby's growth.  The fetal heartbeat will be listened to.  Any test results from the previous visit will be discussed.  You may have a cervical check near your due date to see if you have effaced. At around 36 weeks, your caregiver will check your cervix. At the same time, your caregiver will also perform a test on the secretions of the vaginal tissue. This test is to determine if a type of bacteria, Group B streptococcus, is present. Your caregiver will explain this further. Your caregiver may ask you:  What your birth plan is.  How you are feeling.  If you are feeling the baby move.  If you have had any abnormal symptoms, such as leaking fluid, bleeding, severe headaches, or abdominal cramping.  If you have any questions. Other tests or screenings that may be performed during your third trimester include:  Blood tests that check for low iron levels (anemia).  Fetal testing to check the health, activity level, and growth of the fetus. Testing is done if you have certain medical conditions or if there are problems during the pregnancy. FALSE LABOR You may feel small, irregular contractions that eventually go away. These are called Braxton Hicks contractions, or false labor. Contractions may last for hours, days, or even weeks before true labor sets in. If contractions come at regular intervals, intensify, or become painful, it is best to be seen by your caregiver.  SIGNS OF LABOR   Menstrual-like cramps.  Contractions that are 5 minutes apart or less.  Contractions that start on the top of the uterus and spread down to the lower abdomen and back.  A sense of increased pelvic pressure or back pain.  A watery or bloody mucus discharge that comes from the vagina. If you have  any of these signs before the 37th week of pregnancy, call your caregiver right away. You need to go to the hospital to get checked immediately. HOME CARE INSTRUCTIONS   Avoid all smoking, herbs, alcohol, and unprescribed drugs. These chemicals affect the formation and growth of the baby.  Follow your caregiver's instructions regarding medicine use. There are medicines that are either safe or unsafe to take during pregnancy.  Exercise only as directed by your caregiver. Experiencing uterine cramps is a good sign to stop exercising.  Continue to eat regular, healthy meals.  Wear a good support bra for breast tenderness.  Do not use hot tubs, steam rooms, or saunas.  Wear your seat belt at all times when driving.  Avoid raw meat, uncooked cheese, cat litter boxes, and soil used by cats. These carry germs that can  cause birth defects in the baby.  Take your prenatal vitamins.  Try taking a stool softener (if your caregiver approves) if you develop constipation. Eat more high-fiber foods, such as fresh vegetables or fruit and whole grains. Drink plenty of fluids to keep your urine clear or pale yellow.  Take warm sitz baths to soothe any pain or discomfort caused by hemorrhoids. Use hemorrhoid cream if your caregiver approves.  If you develop varicose veins, wear support hose. Elevate your feet for 15 minutes, 3-4 times a day. Limit salt in your diet.  Avoid heavy lifting, wear low heal shoes, and practice good posture.  Rest a lot with your legs elevated if you have leg cramps or low back pain.  Visit your dentist if you have not gone during your pregnancy. Use a soft toothbrush to brush your teeth and be gentle when you floss.  A sexual relationship may be continued unless your caregiver directs you otherwise.  Do not travel far distances unless it is absolutely necessary and only with the approval of your caregiver.  Take prenatal classes to understand, practice, and ask questions  about the labor and delivery.  Make a trial run to the hospital.  Pack your hospital bag.  Prepare the baby's nursery.  Continue to go to all your prenatal visits as directed by your caregiver. SEEK MEDICAL CARE IF:  You are unsure if you are in labor or if your water has broken.  You have dizziness.  You have mild pelvic cramps, pelvic pressure, or nagging pain in your abdominal area.  You have persistent nausea, vomiting, or diarrhea.  You have a bad smelling vaginal discharge.  You have pain with urination. SEEK IMMEDIATE MEDICAL CARE IF:   You have a fever.  You are leaking fluid from your vagina.  You have spotting or bleeding from your vagina.  You have severe abdominal cramping or pain.  You have rapid weight loss or gain.  You have shortness of breath with chest pain.  You notice sudden or extreme swelling of your face, hands, ankles, feet, or legs.  You have not felt your baby move in over an hour.  You have severe headaches that do not go away with medicine.  You have vision changes. Document Released: 01/08/2001 Document Revised: 01/19/2013 Document Reviewed: 03/17/2012 [facility] Patient Information 2015 [place], [place]. This information is not intended to replace advice given to you by your health care provider. Make sure you discuss any questions you have with your health care provider.  [place] Pediatricians/Family Doctors:  [facility] [phone]            [facility] [phone]                 [facility] [phone] (usually not accepting new patients unless you have family there already, you are always welcome to call and ask)       [facility] Department [phone]       [facility] Pediatricians/Family Doctors:   [facility]: [phone]  [facility]: [phone]  [facility]: [phone]  [facility] Doctors:   [facility]: [phone]   [facility]: [phone]  [facility] Family Doctors:  [facility]: [phone]

## 2020-01-28 LAB — CBC
Hematocrit: 37.1 % (ref 34.0–46.6)
Hemoglobin: 12.2 g/dL (ref 11.1–15.9)
MCH: 29.3 pg (ref 26.6–33.0)
MCHC: 32.9 g/dL (ref 31.5–35.7)
MCV: 89 fL (ref 79–97)
Platelets: 362 10*3/uL (ref 150–450)
RBC: 4.17 x10E6/uL (ref 3.77–5.28)
RDW: 12.1 % (ref 11.7–15.4)
WBC: 12.6 10*3/uL — ABNORMAL HIGH (ref 3.4–10.8)

## 2020-01-28 LAB — GLUCOSE TOLERANCE, 2 HOURS W/ 1HR
Glucose, 1 hour: 118 mg/dL (ref 65–179)
Glucose, 2 hour: 87 mg/dL (ref 65–152)
Glucose, Fasting: 74 mg/dL (ref 65–91)

## 2020-01-28 LAB — ANTIBODY SCREEN: Antibody Screen: NEGATIVE

## 2020-01-28 LAB — HIV ANTIBODY (ROUTINE TESTING W REFLEX): HIV Screen 4th Generation wRfx: NONREACTIVE

## 2020-01-28 LAB — RPR: RPR Ser Ql: NONREACTIVE

## 2020-01-29 NOTE — L&D Delivery Note (Signed)
OB/GYN Faculty Practice Delivery Note  Angel Diaz is a 25 y.o. G1P0000 s/p SVD at [redacted]w[redacted]d. She was admitted for spontaneous labor.   ROM: 8h 95m with clear fluid GBS Status:  Negative/-- (03/02 1400) Maximum Maternal Temperature: 99.3  Labor Progress: . Initial SVE: 3cm. She was AROM'd at 1130 and then pitocin was started at 1330. She then progressed to complete.   Delivery Date/Time: 20:26 on 3/25 Delivery: Called to room and patient was complete and pushing. Head delivered ROA. Loose nuchal cord present, reduced. Shoulder and body delivered in usual fashion. Infant with spontaneous cry, placed on mother's abdomen, dried and stimulated. Cord clamped x 2 after 1-minute delay, and cut by FOB. Cord blood drawn. Placenta delivered spontaneously with gentle cord traction. Fundus firm with massage and Pitocin. Labia, perineum, vagina, and cervix inspected inspected with second degree perineal tear, repaired in routine fashion.  Baby Weight: pending  Placenta: Sent to L&D Complications: None Lacerations: second degree EBL: 100 mL Analgesia: Epidural   Infant:  APGAR (1 MIN): 8   APGAR (5 MINS): 9   APGAR (10 MINS):     Casper Harrison, MD [facility] Family Medicine Fellow, [facility] for [facility], [facility] 04/21/2020, 8:51 PM

## 2020-02-14 ENCOUNTER — Telehealth: Payer: 59 | Admitting: Family Medicine

## 2020-02-15 ENCOUNTER — Telehealth (INDEPENDENT_AMBULATORY_CARE_PROVIDER_SITE_OTHER): Payer: 59 | Admitting: Women's Health

## 2020-02-15 ENCOUNTER — Other Ambulatory Visit: Payer: Self-pay

## 2020-02-15 ENCOUNTER — Other Ambulatory Visit (INDEPENDENT_AMBULATORY_CARE_PROVIDER_SITE_OTHER): Payer: 59 | Admitting: *Deleted

## 2020-02-15 ENCOUNTER — Encounter: Payer: Self-pay | Admitting: Women's Health

## 2020-02-15 VITALS — BP 121/73 | HR 92

## 2020-02-15 DIAGNOSIS — M549 Dorsalgia, unspecified: Secondary | ICD-10-CM | POA: Diagnosis not present

## 2020-02-15 DIAGNOSIS — O99891 Other specified diseases and conditions complicating pregnancy: Secondary | ICD-10-CM

## 2020-02-15 DIAGNOSIS — Z3A30 30 weeks gestation of pregnancy: Secondary | ICD-10-CM

## 2020-02-15 DIAGNOSIS — Z3403 Encounter for supervision of normal first pregnancy, third trimester: Secondary | ICD-10-CM

## 2020-02-15 DIAGNOSIS — O26893 Other specified pregnancy related conditions, third trimester: Secondary | ICD-10-CM

## 2020-02-15 DIAGNOSIS — R109 Unspecified abdominal pain: Secondary | ICD-10-CM

## 2020-02-15 LAB — POCT URINALYSIS DIPSTICK OB
Glucose, UA: NEGATIVE
Ketones, UA: NEGATIVE
Leukocytes, UA: NEGATIVE
Nitrite, UA: NEGATIVE
POC,PROTEIN,UA: NEGATIVE

## 2020-02-15 NOTE — Progress Notes (Addendum)
   NURSE VISIT- UTI SYMPTOMS   SUBJECTIVE:  Angel Diaz is a 25 y.o. G36P0000 female here for UTI symptoms. She is [redacted]w[redacted]d pregnant. She reports back pain.  OBJECTIVE:  LMP 07/18/2019   Appears well, in no apparent distress  Results for orders placed or performed in visit on 02/15/20 (from the past 24 hour(s))  POC Urinalysis Dipstick OB   Collection Time: 02/15/20  4:21 PM  Result Value Ref Range   Color, UA     Clarity, UA     Glucose, UA Negative Negative   Bilirubin, UA     Ketones, UA neg    Spec Grav, UA     Blood, UA trace    pH, UA     POC,PROTEIN,UA Negative Negative, Trace, Small (1+), Moderate (2+), Large (3+), 4+   Urobilinogen, UA     Nitrite, UA neg    Leukocytes, UA Negative Negative   Appearance     Odor      ASSESSMENT: Pregnancy [redacted]w[redacted]d with UTI symptoms and negative nitrites  PLAN: Note routed to [name], CNM, [facility]   Rx sent by provider today: No Urine culture sent Call or return to clinic prn if these symptoms worsen or fail to improve as anticipated. Follow-up: as scheduled   Malachy Mood  02/15/2020 4:28 PM   Chart reviewed for nurse visit. Agree with plan of care. Rt flank pain, neg urine dip, will send urine cx. Reviewed pyelo s/s.  Cheral Marker, [place] 02/16/2020 1:16 PM

## 2020-02-15 NOTE — Patient Instructions (Signed)
Angel Diaz, I greatly value your feedback.  If you receive a survey following your visit with [place] today, we appreciate you taking the time to fill it out.  Thanks, Angel Diaz, CNM, WHNP-BC   [facility] ([address] [place], [place] [address]) Entrance C, located off of [address] valet parking  Go to [email or web address] to register for FREE online childbirth classes   Call the office [phone]) or go to [facility] if:  You begin to have strong, frequent contractions  Your water breaks.  Sometimes it is a big gush of fluid, sometimes it is just a trickle that keeps getting your panties wet or running down your legs  You have vaginal bleeding.  It is normal to have a small amount of spotting if your cervix was checked.   You don't feel your baby moving like normal.  If you don't, get you something to eat and drink and lay down and focus on feeling your baby move.  You should feel at least 10 movements in 2 hours.  If you don't, you should call the office or go to [facility].    Tdap Vaccine  It is recommended that you get the Tdap vaccine during the third trimester of EACH pregnancy to help protect your baby from getting pertussis (whooping cough)  27-36 weeks is the BEST time to do this so that you can pass the protection on to your baby. During pregnancy is better than after pregnancy, but if you are unable to get it during pregnancy it will be offered at the hospital.   You can get this vaccine with [place], at the health department, your family doctor, or some local pharmacies  Everyone who will be around your baby should also be up-to-date on their vaccines before the baby comes. Adults (who are not pregnant) only need 1 dose of Tdap during adulthood.   [place] Pediatricians/Family Doctors:  [facility] [phone]            [facility] [phone]                 [facility] [phone] (usually not accepting new patients unless you have family there already, you are always welcome to call and ask)       [facility] [phone]       [facility] Pediatricians/Family Doctors:   [facility]: [phone]  [facility]: [phone]  [facility]: [phone]  [facility] Doctors:   [facility]: [phone]   [facility]: [phone]  [facility] Doctors:  [facility]: [phone]   Home Blood Pressure Monitoring for Patients   Your provider has recommended that you check your blood pressure (BP) at least once a week at home. If you do not have a blood pressure cuff at home, one will be provided for you. Contact your provider if you have not received your monitor within 1 week.   Helpful Tips for Accurate Home Blood Pressure Checks  . Don't smoke, exercise, or drink caffeine 30 minutes before checking your BP . Use the restroom before checking your BP (a full bladder can raise your pressure) . Relax in a comfortable upright chair . Feet on the ground . Left arm resting comfortably on a flat surface at the level of your heart . Legs uncrossed . Back supported . Sit quietly and don't talk . Place the cuff on your  bare arm . Adjust snuggly, so that only two fingertips can fit between your skin and the top of the cuff . Check 2 readings separated by at least one minute . Keep a log of your BP readings . For a visual, please reference this diagram: http://ccnc.care/bpdiagram  Provider Name: [facility]     Phone: [phone]  Zone 1: ALL CLEAR  Continue to monitor your symptoms:  . BP reading is less than 140 (top number) or less than 90 (bottom number)  . No right upper stomach pain . No headaches or seeing spots . No feeling nauseated or throwing up . No swelling in face and hands  Zone 2: CAUTION Call  your doctor's office for any of the following:  . BP reading is greater than 140 (top number) or greater than 90 (bottom number)  . Stomach pain under your ribs in the middle or right side . Headaches or seeing spots . Feeling nauseated or throwing up . Swelling in face and hands  Zone 3: EMERGENCY  Seek immediate medical care if you have any of the following:  . BP reading is greater than160 (top number) or greater than 110 (bottom number) . Severe headaches not improving with Tylenol . Serious difficulty catching your breath . Any worsening symptoms from Zone 2   Third Trimester of Pregnancy The third trimester is from week 29 through week 42, months 7 through 9. The third trimester is a time when the fetus is growing rapidly. At the end of the ninth month, the fetus is about 20 inches in length and weighs 6-10 pounds.  BODY CHANGES Your body goes through many changes during pregnancy. The changes vary from woman to woman.   Your weight will continue to increase. You can expect to gain 25-35 pounds (11-16 kg) by the end of the pregnancy.  You may begin to get stretch marks on your hips, abdomen, and breasts.  You may urinate more often because the fetus is moving lower into your pelvis and pressing on your bladder.  You may develop or continue to have heartburn as a result of your pregnancy.  You may develop constipation because certain hormones are causing the muscles that push waste through your intestines to slow down.  You may develop hemorrhoids or swollen, bulging veins (varicose veins).  You may have pelvic pain because of the weight gain and pregnancy hormones relaxing your joints between the bones in your pelvis. Backaches may result from overexertion of the muscles supporting your posture.  You may have changes in your hair. These can include thickening of your hair, rapid growth, and changes in texture. Some women also have hair loss during or after pregnancy, or hair  that feels dry or thin. Your hair will most likely return to normal after your baby is born.  Your breasts will continue to grow and be tender. A yellow discharge may leak from your breasts called colostrum.  Your belly button may stick out.  You may feel short of breath because of your expanding uterus.  You may notice the fetus "dropping," or moving lower in your abdomen.  You may have a bloody mucus discharge. This usually occurs a few days to a week before labor begins.  Your cervix becomes thin and soft (effaced) near your due date. WHAT TO EXPECT AT YOUR PRENATAL EXAMS  You will have prenatal exams every 2 weeks until week 36. Then, you will have weekly prenatal exams. During a routine prenatal visit:  You will be weighed to make sure you and the fetus are growing normally.  Your blood pressure is taken.  Your abdomen will be measured to track your baby's growth.  The fetal heartbeat will be listened to.  Any test results from the previous visit will be discussed.  You may have a cervical check near your due date to see if you have effaced. At around 36 weeks, your caregiver will check your cervix. At the same time, your caregiver will also perform a test on the secretions of the vaginal tissue. This test is to determine if a type of bacteria, Group B streptococcus, is present. Your caregiver will explain this further. Your caregiver may ask you:  What your birth plan is.  How you are feeling.  If you are feeling the baby move.  If you have had any abnormal symptoms, such as leaking fluid, bleeding, severe headaches, or abdominal cramping.  If you have any questions. Other tests or screenings that may be performed during your third trimester include:  Blood tests that check for low iron levels (anemia).  Fetal testing to check the health, activity level, and growth of the fetus. Testing is done if you have certain medical conditions or if there are problems during the  pregnancy. FALSE LABOR You may feel small, irregular contractions that eventually go away. These are called Braxton Hicks contractions, or false labor. Contractions may last for hours, days, or even weeks before true labor sets in. If contractions come at regular intervals, intensify, or become painful, it is best to be seen by your caregiver.  SIGNS OF LABOR   Menstrual-like cramps.  Contractions that are 5 minutes apart or less.  Contractions that start on the top of the uterus and spread down to the lower abdomen and back.  A sense of increased pelvic pressure or back pain.  A watery or bloody mucus discharge that comes from the vagina. If you have any of these signs before the 37th week of pregnancy, call your caregiver right away. You need to go to the hospital to get checked immediately. HOME CARE INSTRUCTIONS   Avoid all smoking, herbs, alcohol, and unprescribed drugs. These chemicals affect the formation and growth of the baby.  Follow your caregiver's instructions regarding medicine use. There are medicines that are either safe or unsafe to take during pregnancy.  Exercise only as directed by your caregiver. Experiencing uterine cramps is a good sign to stop exercising.  Continue to eat regular, healthy meals.  Wear a good support bra for breast tenderness.  Do not use hot tubs, steam rooms, or saunas.  Wear your seat belt at all times when driving.  Avoid raw meat, uncooked cheese, cat litter boxes, and soil used by cats. These carry germs that can cause birth defects in the baby.  Take your prenatal vitamins.  Try taking a stool softener (if your caregiver approves) if you develop constipation. Eat more high-fiber foods, such as fresh vegetables or fruit and whole grains. Drink plenty of fluids to keep your urine clear or pale yellow.  Take warm sitz baths to soothe any pain or discomfort caused by hemorrhoids. Use hemorrhoid cream if your caregiver approves.  If you  develop varicose veins, wear support hose. Elevate your feet for 15 minutes, 3-4 times a day. Limit salt in your diet.  Avoid heavy lifting, wear low heal shoes, and practice good posture.  Rest a lot with your legs elevated if you have leg cramps or low  back pain.  Visit your dentist if you have not gone during your pregnancy. Use a soft toothbrush to brush your teeth and be gentle when you floss.  A sexual relationship may be continued unless your caregiver directs you otherwise.  Do not travel far distances unless it is absolutely necessary and only with the approval of your caregiver.  Take prenatal classes to understand, practice, and ask questions about the labor and delivery.  Make a trial run to the hospital.  Pack your hospital bag.  Prepare the baby's nursery.  Continue to go to all your prenatal visits as directed by your caregiver. SEEK MEDICAL CARE IF:  You are unsure if you are in labor or if your water has broken.  You have dizziness.  You have mild pelvic cramps, pelvic pressure, or nagging pain in your abdominal area.  You have persistent nausea, vomiting, or diarrhea.  You have a bad smelling vaginal discharge.  You have pain with urination. SEEK IMMEDIATE MEDICAL CARE IF:   You have a fever.  You are leaking fluid from your vagina.  You have spotting or bleeding from your vagina.  You have severe abdominal cramping or pain.  You have rapid weight loss or gain.  You have shortness of breath with chest pain.  You notice sudden or extreme swelling of your face, hands, ankles, feet, or legs.  You have not felt your baby move in over an hour.  You have severe headaches that do not go away with medicine.  You have vision changes. Document Released: 01/08/2001 Document Revised: 01/19/2013 Document Reviewed: 03/17/2012 [facility] Patient Information 2015 [place], [place]. This information is not intended to replace advice given to you by your health  care provider. Make sure you discuss any questions you have with your health care provider.

## 2020-02-15 NOTE — Progress Notes (Signed)
TELEHEALTH VIRTUAL OBSTETRICS VISIT ENCOUNTER NOTE Patient name: Angel Diaz MRN [id number]  Date of birth: [birth year]  I connected with patient on 02/15/20 at  1:30 PM EST by MyChart video  and verified that I am speaking with the correct person using two identifiers. Pt is not currently in our office, she is at home.  The provider is in the office.    I discussed the limitations, risks, security and privacy concerns of performing an evaluation and management service by telephone and the availability of in person appointments. I also discussed with the patient that there may be a patient responsible charge related to this service. The patient expressed understanding and agreed to proceed.  Chief Complaint:   Routine Prenatal Visit  History of Present Illness:   Angel Diaz is a 25 y.o. G50P0000 female at [redacted]w[redacted]d with an Estimated Date of Delivery: 04/23/20 being evaluated today for ongoing management of a low-risk pregnancy.  Depression screen [facility] 2/9 01/27/2020 10/07/2019 11/10/2017  Decreased Interest 1 2 0  Down, Depressed, Hopeless 0 1 0  PHQ - 2 Score 1 3 0  Altered sleeping 1 0 -  Tired, decreased energy 1 2 -  Change in appetite 0 1 -  Feeling bad or failure about yourself  0 0 -  Trouble concentrating 0 1 -  Moving slowly or fidgety/restless 0 0 -  Suicidal thoughts 0 0 -  PHQ-9 Score 3 7 -    Today she reports Rt flank x few days. Was in yard day before picking up stuff. Has h/o mild bilateral hydronephrosis and urinary retention w/ recurrent utis (none since pregnant- stopped having sex to be on safe side). Last cx in Sept normal, last dipstick 12/30 wnl. Denies fever/chills/uti sx.  Contractions: Not present. Vag. Bleeding: None.  Movement: Present. denies leaking of fluid. Review of Systems:   Pertinent items are noted in HPI Denies abnormal vaginal discharge w/ itching/odor/irritation, headaches, visual changes, shortness of breath, chest pain, abdominal  pain, severe nausea/vomiting, or problems with urination or bowel movements unless otherwise stated above. Pertinent History Reviewed:  Reviewed past medical,surgical, social, obstetrical and family history.  Reviewed problem list, medications and allergies. Physical Assessment:   Vitals:   02/15/20 1336  BP: 121/73  Pulse: 92  There is no height or weight on file to calculate BMI.        Physical Examination:   General:  Alert, oriented and cooperative.   Mental Status: Normal mood and affect perceived. Normal judgment and thought content.  Rest of physical exam deferred due to type of encounter  No results found for this or any previous visit (from the past 24 hour(s)).  Assessment & Plan:  1) Pregnancy G1P0000 at [redacted]w[redacted]d with an Estimated Date of Delivery: 04/23/20   2) Rt flank pain, no fever/chills/uti sx, but does have h/o recurrent uti's w/ bilateral mild hydronephrosis and urinary retention prior to pregnancy. To come in to give urine specimen. Reviewed warning s/s pyelo/reasons to seek care.    Meds: No orders of the defined types were placed in this encounter.   Labs/procedures today: none  Plan:  Continue routine obstetrical care.  Has home bp cuff.  Check bp weekly, let us know if >140/90.  Next visit: prefers online    Reviewed: Preterm labor symptoms and general obstetric precautions including but not limited to vaginal bleeding, contractions, leaking of fluid and fetal movement were reviewed in detail with the patient. The patient was advised to call  back or seek an in-person office evaluation/go to [facility]U at [facility] for any urgent or concerning symptoms. All questions were answered. Please refer to After Visit Summary for other counseling recommendations.    I provided 15 minutes of non-face-to-face time during this encounter.  Follow-up: Return for coming today for nurse visit/urine specimen; then 2wks for LROB w/ CNM online.  No orders of the  defined types were placed in this encounter.  Cheral Marker CNM, [facility] 02/15/2020 2:03 PM

## 2020-02-17 LAB — URINE CULTURE

## 2020-02-17 LAB — SPECIMEN STATUS REPORT

## 2020-02-18 ENCOUNTER — Telehealth: Payer: 59 | Admitting: Women's Health

## 2020-03-01 ENCOUNTER — Telehealth (INDEPENDENT_AMBULATORY_CARE_PROVIDER_SITE_OTHER): Payer: 59 | Admitting: Advanced Practice Midwife

## 2020-03-01 ENCOUNTER — Encounter: Payer: Self-pay | Admitting: Advanced Practice Midwife

## 2020-03-01 ENCOUNTER — Other Ambulatory Visit: Payer: Self-pay

## 2020-03-01 VITALS — BP 113/73 | HR 84

## 2020-03-01 DIAGNOSIS — Z3A32 32 weeks gestation of pregnancy: Secondary | ICD-10-CM

## 2020-03-01 DIAGNOSIS — Z3403 Encounter for supervision of normal first pregnancy, third trimester: Secondary | ICD-10-CM

## 2020-03-01 NOTE — Progress Notes (Signed)
TELEHEALTH VIRTUAL OBSTETRICS VISIT ENCOUNTER NOTE Patient name: Angel Diaz MRN [id number]  Date of birth: 1995/08/03  I connected with patient on 03/01/20 at  8:30 AM EST by MyChart and verified that I am speaking with the correct person using two identifiers. Due to COVID-19 recommendations, pt is not currently in our office but she is at home; however the provider is in the office.    I discussed the limitations, risks, security and privacy concerns of performing an evaluation and management service by telephone and the availability of in person appointments. I also discussed with the patient that there may be a patient responsible charge related to this service. The patient expressed understanding and agreed to proceed.  Chief Complaint:   Routine Prenatal Visit  History of Present Illness:   Angel Diaz is a 25 y.o. G71P0000 female at [redacted]w[redacted]d with an Estimated Date of Delivery: 04/23/20 being evaluated today for ongoing management of a low-risk pregnancy.  Depression screen [facility] 2/9 01/27/2020 10/07/2019 11/10/2017  Decreased Interest 1 2 0  Down, Depressed, Hopeless 0 1 0  PHQ - 2 Score 1 3 0  Altered sleeping 1 0 -  Tired, decreased energy 1 2 -  Change in appetite 0 1 -  Feeling bad or failure about yourself  0 0 -  Trouble concentrating 0 1 -  Moving slowly or fidgety/restless 0 0 -  Suicidal thoughts 0 0 -  PHQ-9 Score 3 7 -    Today she reports occ cramping that resolves in a few minutes. Contractions: Irritability. Vag. Bleeding: None.  Movement: Present. denies leaking of fluid. Review of Systems:   Pertinent items are noted in HPI Denies abnormal vaginal discharge w/ itching/odor/irritation, headaches, visual changes, shortness of breath, chest pain, abdominal pain, severe nausea/vomiting, or problems with urination or bowel movements unless otherwise stated above. Pertinent History Reviewed:  Reviewed past medical,surgical, social, obstetrical and family  history.  Reviewed problem list, medications and allergies. Physical Assessment:   Vitals:   03/01/20 0839  BP: 113/73  Pulse: 84  There is no height or weight on file to calculate BMI.        Physical Examination:   General:  Alert, oriented and cooperative.   Mental Status: Normal mood and affect perceived. Normal judgment and thought content.  Rest of physical exam deferred due to type of encounter  No results found for this or any previous visit (from the past 24 hour(s)).  Assessment & Plan:  1) Pregnancy G1P0000 at [redacted]w[redacted]d with an Estimated Date of Delivery: 04/23/20     Meds: No orders of the defined types were placed in this encounter.   Labs/procedures today: none  Plan:  Continue routine obstetrical care.  Has home bp cuff.  Check bp weekly, let us know if >140/90.  Next visit: prefers online    Reviewed: Preterm labor symptoms and general obstetric precautions including but not limited to vaginal bleeding, contractions, leaking of fluid and fetal movement were reviewed in detail with the patient. The patient was advised to call back or seek an in-person office evaluation/go to [facility] at [facility] for any urgent or concerning symptoms. All questions were answered. Please refer to After Visit Summary for other counseling recommendations.    I provided 12 minutes of non-face-to-face time during this encounter.  Follow-up: Return in about 2 weeks (around 03/15/2020) for LROB, MyChart video.  No orders of the defined types were placed in this encounter.  Angel Diaz  CNM 03/01/2020 9:06 AM

## 2020-03-15 ENCOUNTER — Telehealth (INDEPENDENT_AMBULATORY_CARE_PROVIDER_SITE_OTHER): Payer: 59 | Admitting: Advanced Practice Midwife

## 2020-03-15 ENCOUNTER — Other Ambulatory Visit: Payer: Self-pay

## 2020-03-15 ENCOUNTER — Encounter: Payer: Self-pay | Admitting: Advanced Practice Midwife

## 2020-03-15 VITALS — BP 106/72 | HR 77 | Wt 200.0 lb

## 2020-03-15 DIAGNOSIS — Z3403 Encounter for supervision of normal first pregnancy, third trimester: Secondary | ICD-10-CM

## 2020-03-15 DIAGNOSIS — Z3A34 34 weeks gestation of pregnancy: Secondary | ICD-10-CM

## 2020-03-15 NOTE — Progress Notes (Signed)
TELEHEALTH VIRTUAL OBSTETRICS VISIT ENCOUNTER NOTE Patient name: Angel Diaz MRN [id number]  Date of birth: 13-Apr-1995  I connected with patient on 03/15/20 at  9:50 AM EST by MyChart/telephone (combination due to technical issues) and verified that I am speaking with the correct person using two identifiers. Due to COVID-19 recommendations, pt is not currently in our office however I am; she is at home.    I discussed the limitations, risks, security and privacy concerns of performing an evaluation and management service by telephone and the availability of in person appointments. I also discussed with the patient that there may be a patient responsible charge related to this service. The patient expressed understanding and agreed to proceed.  Chief Complaint:   Routine Prenatal Visit  History of Present Illness:   Angel Diaz is a 25 y.o. G18P0000 female at [redacted]w[redacted]d with an Estimated Date of Delivery: 04/23/20 being evaluated today for ongoing management of a low-risk pregnancy.  Depression screen [facility] 2/9 01/27/2020 10/07/2019 11/10/2017  Decreased Interest 1 2 0  Down, Depressed, Hopeless 0 1 0  PHQ - 2 Score 1 3 0  Altered sleeping 1 0 -  Tired, decreased energy 1 2 -  Change in appetite 0 1 -  Feeling bad or failure about yourself  0 0 -  Trouble concentrating 0 1 -  Moving slowly or fidgety/restless 0 0 -  Suicidal thoughts 0 0 -  PHQ-9 Score 3 7 -    Today she reports having some cramping last week for a couple of nights but it has improved; concerned re weight gain/water weight. Contractions: Not present. Vag. Bleeding: None.  Movement: Present. denies leaking of fluid. Review of Systems:   Pertinent items are noted in HPI Denies abnormal vaginal discharge w/ itching/odor/irritation, headaches, visual changes, shortness of breath, chest pain, abdominal pain, severe nausea/vomiting, or problems with urination or bowel movements unless otherwise stated  above. Pertinent History Reviewed:  Reviewed past medical,surgical, social, obstetrical and family history.  Reviewed problem list, medications and allergies. Physical Assessment:   Vitals:   03/15/20 1019  BP: 106/72  Pulse: 77  Weight: 200 lb (90.7 kg)  Body mass index is 33.28 kg/m.        Physical Examination:   General:  Alert, oriented and cooperative.   Mental Status: Normal mood and affect perceived. Normal judgment and thought content.  Rest of physical exam deferred due to type of encounter  No results found for this or any previous visit (from the past 24 hour(s)).  Assessment & Plan:  1) Pregnancy G1P0000 at [redacted]w[redacted]d with an Estimated Date of Delivery: 04/23/20     Meds: No orders of the defined types were placed in this encounter.   Labs/procedures today: none  Plan:  Continue routine obstetrical care.  Has home bp cuff.  Check bp weekly, let us know if >140/90.  Next visit: prefers will be in person for Tdap/GBS/cultures    Reviewed: Preterm labor symptoms and general obstetric precautions including but not limited to vaginal bleeding, contractions, leaking of fluid and fetal movement were reviewed in detail with the patient. The patient was advised to call back or seek an in-person office evaluation/[facility] for any urgent or concerning symptoms. All questions were answered. Please refer to After Visit Summary for other counseling recommendations.    I provided 10 minutes of non-face-to-face time during this encounter.  Follow-up: Return in about 2 weeks (around 03/29/2020) for LROB,  in person; Tdap.  No orders of the defined types were placed in this encounter.  [name] CNM 03/15/2020 10:33 AM

## 2020-03-29 ENCOUNTER — Other Ambulatory Visit (HOSPITAL_COMMUNITY)
Admission: RE | Admit: 2020-03-29 | Discharge: 2020-03-29 | Disposition: A | Discharge status: Home / Self Care | Payer: 59 | Source: Ambulatory Visit | Attending: Obstetrics & Gynecology | Admitting: Obstetrics & Gynecology

## 2020-03-29 ENCOUNTER — Other Ambulatory Visit: Payer: Self-pay

## 2020-03-29 ENCOUNTER — Ambulatory Visit (INDEPENDENT_AMBULATORY_CARE_PROVIDER_SITE_OTHER): Payer: 59 | Admitting: Women's Health

## 2020-03-29 ENCOUNTER — Encounter: Payer: Self-pay | Admitting: Women's Health

## 2020-03-29 VITALS — BP 117/69 | HR 97 | Wt 203.6 lb

## 2020-03-29 DIAGNOSIS — Z3A36 36 weeks gestation of pregnancy: Secondary | ICD-10-CM | POA: Diagnosis not present

## 2020-03-29 DIAGNOSIS — Z3483 Encounter for supervision of other normal pregnancy, third trimester: Secondary | ICD-10-CM | POA: Insufficient documentation

## 2020-03-29 DIAGNOSIS — Z23 Encounter for immunization: Secondary | ICD-10-CM | POA: Diagnosis not present

## 2020-03-29 DIAGNOSIS — Z3403 Encounter for supervision of normal first pregnancy, third trimester: Secondary | ICD-10-CM

## 2020-03-29 NOTE — Progress Notes (Signed)
   LOW-RISK PREGNANCY VISIT Patient name: Angel Diaz MRN [id number]  Date of birth: 07-16-1995 Chief Complaint:   Routine Prenatal Visit  History of Present Illness:   Angel Diaz is a 25 y.o. G38P0000 female at [redacted]w[redacted]d with an Estimated Date of Delivery: 04/23/20 being seen today for ongoing management of a low-risk pregnancy.  Depression screen [facility] 2/9 01/27/2020 10/07/2019 11/10/2017  Decreased Interest 1 2 0  Down, Depressed, Hopeless 0 1 0  PHQ - 2 Score 1 3 0  Altered sleeping 1 0 -  Tired, decreased energy 1 2 -  Change in appetite 0 1 -  Feeling bad or failure about yourself  0 0 -  Trouble concentrating 0 1 -  Moving slowly or fidgety/restless 0 0 -  Suicidal thoughts 0 0 -  PHQ-9 Score 3 7 -    Today she reports no complaints. Contractions: Irritability. Vag. Bleeding: None.  Movement: Present. denies leaking of fluid. Review of Systems:   Pertinent items are noted in HPI Denies abnormal vaginal discharge w/ itching/odor/irritation, headaches, visual changes, shortness of breath, chest pain, abdominal pain, severe nausea/vomiting, or problems with urination or bowel movements unless otherwise stated above. Pertinent History Reviewed:  Reviewed past medical,surgical, social, obstetrical and family history.  Reviewed problem list, medications and allergies. Physical Assessment:   Vitals:   03/29/20 0908  BP: 117/69  Pulse: 97  Weight: 203 lb 9.6 oz (92.4 kg)  Body mass index is 33.88 kg/m.        Physical Examination:   General appearance: Well appearing, and in no distress  Mental status: Alert, oriented to person, place, and time  Skin: Warm & dry  Cardiovascular: Normal heart rate noted  Respiratory: Normal respiratory effort, no distress  Abdomen: Soft, gravid, nontender  Pelvic: Cervical exam deferred , cultures collected        Extremities: Edema: Trace  Fetal Status: Fetal Heart Rate (bpm): 160 Fundal Height: 36 cm Movement: Present  Presentation: Vertex  Chaperone: Angel Neas   No results found for this or any previous visit (from the past 24 hour(s)).  Assessment & Plan:  1) Low-risk pregnancy G1P0000 at [redacted]w[redacted]d with an Estimated Date of Delivery: 04/23/20    Meds: No orders of the defined types were placed in this encounter.  Labs/procedures today: GBS, GC/CT and tdap  Plan:  Continue routine obstetrical care  Next visit: prefers in person    Reviewed: Preterm labor symptoms and general obstetric precautions including but not limited to vaginal bleeding, contractions, leaking of fluid and fetal movement were reviewed in detail with the patient.  All questions were answered. Has home bp cuff. Check bp weekly, let us know if >140/90.   Follow-up: Return in about 1 week (around 04/05/2020) for LROB, CNM, in person.  No future appointments.  Orders Placed This Encounter  Procedures  . Culture, beta strep (group b only)  . Tdap vaccine greater than or equal to 7yo IM   Cheral Marker CNM, [facility] 03/29/2020 9:42 AM

## 2020-03-29 NOTE — Patient Instructions (Signed)
Angel Diaz, I greatly value your feedback.  If you receive a survey following your visit with [place] today, we appreciate you taking the time to fill it out.  Thanks, [name], CNM, WHNP-BC  [facility] ([address] [place], [place] [address]) Entrance C, located off of [address] valet parking   Go to [email or web address] to register for FREE online childbirth classes    Call the office [phone]) or go to [facility] if:  You begin to have strong, frequent contractions  Your water breaks.  Sometimes it is a big gush of fluid, sometimes it is just a trickle that keeps getting your panties wet or running down your legs  You have vaginal bleeding.  It is normal to have a small amount of spotting if your cervix was checked.   You don't feel your baby moving like normal.  If you don't, get you something to eat and drink and lay down and focus on feeling your baby move.  You should feel at least 10 movements in 2 hours.  If you don't, you should call the office or go to [facility].   Call the office [phone]) or go to [facility] hospital for these signs of pre-eclampsia:  Severe headache that does not go away with Tylenol  Visual changes- seeing spots, double, blurred vision  Pain under your right breast or upper abdomen that does not go away with Tums or heartburn medicine  Nausea and/or vomiting  Severe swelling in your hands, feet, and face    Home Blood Pressure Monitoring for Patients   Your provider has recommended that you check your blood pressure (BP) at least once a week at home. If you do not have a blood pressure cuff at home, one will be provided for you. Contact your provider if you have not received your monitor within 1 week.   Helpful Tips for Accurate Home Blood Pressure Checks  . Don't smoke, exercise, or drink caffeine 30 minutes before checking your BP . Use the restroom before checking your BP (a full  bladder can raise your pressure) . Relax in a comfortable upright chair . Feet on the ground . Left arm resting comfortably on a flat surface at the level of your heart . Legs uncrossed . Back supported . Sit quietly and don't talk . Place the cuff on your bare arm . Adjust snuggly, so that only two fingertips can fit between your skin and the top of the cuff . Check 2 readings separated by at least one minute . Keep a log of your BP readings . For a visual, please reference this diagram: http://ccnc.care/bpdiagram  Provider Name: [facility]     Phone: [phone]  Zone 1: ALL CLEAR  Continue to monitor your symptoms:  . BP reading is less than 140 (top number) or less than 90 (bottom number)  . No right upper stomach pain . No headaches or seeing spots . No feeling nauseated or throwing up . No swelling in face and hands  Zone 2: CAUTION Call your doctor's office for any of the following:  . BP reading is greater than 140 (top number) or greater than 90 (bottom number)  . Stomach pain under your ribs in the middle or right side . Headaches or seeing spots . Feeling nauseated or throwing up . Swelling in face and hands  Zone 3: EMERGENCY  Seek immediate medical care if you have any of  the following:  . BP reading is greater than160 (top number) or greater than 110 (bottom number) . Severe headaches not improving with Tylenol . Serious difficulty catching your breath . Any worsening symptoms from Zone 2  Preterm Labor and Birth Information  The normal length of a pregnancy is 39-41 weeks. Preterm labor is when labor starts before 37 completed weeks of pregnancy. What are the risk factors for preterm labor? Preterm labor is more likely to occur in women who:  Have certain infections during pregnancy such as a bladder infection, sexually transmitted infection, or infection inside the uterus (chorioamnionitis).  Have a shorter-than-normal cervix.  Have gone into  preterm labor before.  Have had surgery on their cervix.  Are younger than age 54 or older than age 25.  Are African American.  Are pregnant with twins or multiple babies (multiple gestation).  Take street drugs or smoke while pregnant.  Do not gain enough weight while pregnant.  Became pregnant shortly after having been pregnant. What are the symptoms of preterm labor? Symptoms of preterm labor include:  Cramps similar to those that can happen during a menstrual period. The cramps may happen with diarrhea.  Pain in the abdomen or lower back.  Regular uterine contractions that may feel like tightening of the abdomen.  A feeling of increased pressure in the pelvis.  Increased watery or bloody mucus discharge from the vagina.  Water breaking (ruptured amniotic sac). Why is it important to recognize signs of preterm labor? It is important to recognize signs of preterm labor because babies who are born prematurely may not be fully developed. This can put them at an increased risk for:  Long-term (chronic) heart and lung problems.  Difficulty immediately after birth with regulating body systems, including blood sugar, body temperature, heart rate, and breathing rate.  Bleeding in the brain.  Cerebral palsy.  Learning difficulties.  Death. These risks are highest for babies who are born before 48 weeks of pregnancy. How is preterm labor treated? Treatment depends on the length of your pregnancy, your condition, and the health of your baby. It may involve: 1. Having a stitch (suture) placed in your cervix to prevent your cervix from opening too early (cerclage). 2. Taking or being given medicines, such as: ? Hormone medicines. These may be given early in pregnancy to help support the pregnancy. ? Medicine to stop contractions. ? Medicines to help mature the baby's lungs. These may be prescribed if the risk of delivery is high. ? Medicines to prevent your baby from  developing cerebral palsy. If the labor happens before 34 weeks of pregnancy, you may need to stay in the hospital. What should I do if I think I am in preterm labor? If you think that you are going into preterm labor, call your health care provider right away. How can I prevent preterm labor in future pregnancies? To increase your chance of having a full-term pregnancy:  Do not use any tobacco products, such as cigarettes, chewing tobacco, and e-cigarettes. If you need help quitting, ask your health care provider.  Do not use street drugs or medicines that have not been prescribed to you during your pregnancy.  Talk with your health care provider before taking any herbal supplements, even if you have been taking them regularly.  Make sure you gain a healthy amount of weight during your pregnancy.  Watch for infection. If you think that you might have an infection, get it checked right away.  Make sure to  tell your health care provider if you have gone into preterm labor before. This information is not intended to replace advice given to you by your health care provider. Make sure you discuss any questions you have with your health care provider. Document Revised: 05/08/2018 Document Reviewed: 06/07/2015 Elsevier Patient Education  [place].

## 2020-03-30 LAB — CERVICOVAGINAL ANCILLARY ONLY
Chlamydia: NEGATIVE
Comment: NEGATIVE
Comment: NORMAL
Neisseria Gonorrhea: NEGATIVE

## 2020-04-02 LAB — CULTURE, BETA STREP (GROUP B ONLY): Strep Gp B Culture: NEGATIVE

## 2020-04-10 ENCOUNTER — Ambulatory Visit (INDEPENDENT_AMBULATORY_CARE_PROVIDER_SITE_OTHER): Payer: 59 | Admitting: Women's Health

## 2020-04-10 ENCOUNTER — Encounter: Payer: Self-pay | Admitting: Women's Health

## 2020-04-10 ENCOUNTER — Other Ambulatory Visit: Payer: Self-pay

## 2020-04-10 VITALS — BP 136/86 | HR 101 | Wt 208.0 lb

## 2020-04-10 DIAGNOSIS — Z3403 Encounter for supervision of normal first pregnancy, third trimester: Secondary | ICD-10-CM

## 2020-04-10 NOTE — Progress Notes (Signed)
   LOW-RISK PREGNANCY VISIT Patient name: Angel Diaz MRN [id number]  Date of birth: [birth year] Chief Complaint:   Routine Prenatal Visit  History of Present Illness:   Angel Diaz is a 25 y.o. G41P0000 Angel at [redacted]w[redacted]d with an Estimated Date of Delivery: 04/23/20 being seen today for ongoing management of a low-risk pregnancy.  Depression screen [facility] 2/9 01/27/2020 10/07/2019 11/10/2017  Decreased Interest 1 2 0  Down, Depressed, Hopeless 0 1 0  PHQ - 2 Score 1 3 0  Altered sleeping 1 0 -  Tired, decreased energy 1 2 -  Change in appetite 0 1 -  Feeling bad or failure about yourself  0 0 -  Trouble concentrating 0 1 -  Moving slowly or fidgety/restless 0 0 -  Suicidal thoughts 0 0 -  PHQ-9 Score 3 7 -    Today she reports contractions/pain at night- usually around 10pm. Home bp's normal, Denies ha, visual changes, ruq/epigastric pain, n/v.   Contractions: Irritability. Vag. Bleeding: None.  Movement: Present. denies leaking of fluid. Review of Systems:   Pertinent items are noted in HPI Denies abnormal vaginal discharge w/ itching/odor/irritation, headaches, visual changes, shortness of breath, chest pain, abdominal pain, severe nausea/vomiting, or problems with urination or bowel movements unless otherwise stated above. Pertinent History Reviewed:  Reviewed past medical,surgical, social, obstetrical and family history.  Reviewed problem list, medications and allergies. Physical Assessment:   Vitals:   04/10/20 1554  BP: 136/86  Pulse: (!) 101  Weight: 208 lb (94.3 kg)  Body mass index is 34.61 kg/m.        Physical Examination:   General appearance: Well appearing, and in no distress  Mental status: Alert, oriented to person, place, and time  Skin: Warm & dry  Cardiovascular: Normal heart rate noted  Respiratory: Normal respiratory effort, no distress  Abdomen: Soft, gravid, nontender  Pelvic: Cervical exam performed  Dilation: 1 Effacement (%): 50  Station: -2  Extremities: Edema: Trace  Fetal Status: Fetal Heart Rate (bpm): 140 Fundal Height: 36 cm Movement: Present Presentation: Vertex  Chaperone: Faith Rogue   No results found for this or any previous visit (from the past 24 hour(s)).  Assessment & Plan:  1) Low-risk pregnancy G1P0000 at [redacted]w[redacted]d with an Estimated Date of Delivery: 04/23/20    2) Borderline bp> asymptomatic, home bp's normal. Reviewed pre-e s/s, reasons to seek care  Meds: No orders of the defined types were placed in this encounter.  Labs/procedures today: SVE  Plan:  Continue routine obstetrical care  Next visit: prefers in person    Reviewed: Term labor symptoms and general obstetric precautions including but not limited to vaginal bleeding, contractions, leaking of fluid and fetal movement were reviewed in detail with the patient.  All questions were answered. Has home bp cuff.  Check bp BID, let us know if >140/90.   Follow-up: Return in about 1 week (around 04/17/2020) for LROB, CNM, in person.  No future appointments.  No orders of the defined types were placed in this encounter.  Cheral Marker CNM, [facility] 04/10/2020 4:38 PM

## 2020-04-10 NOTE — Patient Instructions (Signed)
Angel Diaz, I greatly value your feedback.  If you receive a survey following your visit with [place] today, we appreciate you taking the time to fill it out.  Thanks, Angel Diaz, CNM, WHNP-BC  [facility] ([address] [place], [place] [address]) Entrance C, located off of [address] valet parking   Go to [email or web address] to register for FREE online childbirth classes    Call the office [phone]) or go to [facility] if:  You begin to have strong, frequent contractions  Your water breaks.  Sometimes it is a big gush of fluid, sometimes it is just a trickle that keeps getting your panties wet or running down your legs  You have vaginal bleeding.  It is normal to have a small amount of spotting if your cervix was checked.   You don't feel your baby moving like normal.  If you don't, get you something to eat and drink and lay down and focus on feeling your baby move.  You should feel at least 10 movements in 2 hours.  If you don't, you should call the office or go to [facility].   Call the office [phone]) or go to [facility] hospital for these signs of pre-eclampsia:  Severe headache that does not go away with Tylenol  Visual changes- seeing spots, double, blurred vision  Pain under your right breast or upper abdomen that does not go away with Tums or heartburn medicine  Nausea and/or vomiting  Severe swelling in your hands, feet, and face    Home Blood Pressure Monitoring for Patients   Your provider has recommended that you check your blood pressure (BP) at least once a week at home. If you do not have a blood pressure cuff at home, one will be provided for you. Contact your provider if you have not received your monitor within 1 week.   Helpful Tips for Accurate Home Blood Pressure Checks  . Don't smoke, exercise, or drink caffeine 30 minutes before checking your BP . Use the restroom before checking your BP (a  full bladder can raise your pressure) . Relax in a comfortable upright chair . Feet on the ground . Left arm resting comfortably on a flat surface at the level of your heart . Legs uncrossed . Back supported . Sit quietly and don't talk . Place the cuff on your bare arm . Adjust snuggly, so that only two fingertips can fit between your skin and the top of the cuff . Check 2 readings separated by at least one minute . Keep a log of your BP readings . For a visual, please reference this diagram: http://ccnc.care/bpdiagram  Provider Name: [facility]     Phone: [phone]  Zone 1: ALL CLEAR  Continue to monitor your symptoms:  . BP reading is less than 140 (top number) or less than 90 (bottom number)  . No right upper stomach pain . No headaches or seeing spots . No feeling nauseated or throwing up . No swelling in face and hands  Zone 2: CAUTION Call your doctor's office for any of the following:  . BP reading is greater than 140 (top number) or greater than 90 (bottom number)  . Stomach pain under your ribs in the middle or right side . Headaches or seeing spots . Feeling nauseated or throwing up . Swelling in face and hands  Zone 3: EMERGENCY  Seek immediate medical care if you have any of  the following:  . BP reading is greater than160 (top number) or greater than 110 (bottom number) . Severe headaches not improving with Tylenol . Serious difficulty catching your breath . Any worsening symptoms from Zone 2   Braxton Hicks Contractions Contractions of the uterus can occur throughout pregnancy, but they are not always a sign that you are in labor. You may have practice contractions called Braxton Hicks contractions. These false labor contractions are sometimes confused with true labor. What are Braxton Hicks contractions? Braxton Hicks contractions are tightening movements that occur in the muscles of the uterus before labor. Unlike true labor contractions, these  contractions do not result in opening (dilation) and thinning of the cervix. Toward the end of pregnancy (32-34 weeks), Braxton Hicks contractions can happen more often and may become stronger. These contractions are sometimes difficult to tell apart from true labor because they can be very uncomfortable. You should not feel embarrassed if you go to the hospital with false labor. Sometimes, the only way to tell if you are in true labor is for your health care provider to look for changes in the cervix. The health care provider will do a physical exam and may monitor your contractions. If you are not in true labor, the exam should show that your cervix is not dilating and your water has not broken. If there are no other health problems associated with your pregnancy, it is completely safe for you to be sent home with false labor. You may continue to have Braxton Hicks contractions until you go into true labor. How to tell the difference between true labor and false labor True labor  Contractions last 30-70 seconds.  Contractions become very regular.  Discomfort is usually felt in the top of the uterus, and it spreads to the lower abdomen and low back.  Contractions do not go away with walking.  Contractions usually become more intense and increase in frequency.  The cervix dilates and gets thinner. False labor  Contractions are usually shorter and not as strong as true labor contractions.  Contractions are usually irregular.  Contractions are often felt in the front of the lower abdomen and in the groin.  Contractions may go away when you walk around or change positions while lying down.  Contractions get weaker and are shorter-lasting as time goes on.  The cervix usually does not dilate or become thin. Follow these instructions at home:  1. Take over-the-counter and prescription medicines only as told by your health care provider. 2. Keep up with your usual exercises and follow other  instructions from your health care provider. 3. Eat and drink lightly if you think you are going into labor. 4. If Braxton Hicks contractions are making you uncomfortable: ? Change your position from lying down or resting to walking, or change from walking to resting. ? Sit and rest in a tub of warm water. ? Drink enough fluid to keep your urine pale yellow. Dehydration may cause these contractions. ? Do slow and deep breathing several times an hour. 5. Keep all follow-up prenatal visits as told by your health care provider. This is important. Contact a health care provider if:  You have a fever.  You have continuous pain in your abdomen. Get help right away if:  Your contractions become stronger, more regular, and closer together.  You have fluid leaking or gushing from your vagina.  You pass blood-tinged mucus (bloody show).  You have bleeding from your vagina.  You have low back   pain that you never had before.  You feel your baby's head pushing down and causing pelvic pressure.  Your baby is not moving inside you as much as it used to. Summary  Contractions that occur before labor are called Braxton Hicks contractions, false labor, or practice contractions.  Braxton Hicks contractions are usually shorter, weaker, farther apart, and less regular than true labor contractions. True labor contractions usually become progressively stronger and regular, and they become more frequent.  Manage discomfort from [facility] contractions by changing position, resting in a warm bath, drinking plenty of water, or practicing deep breathing. This information is not intended to replace advice given to you by your health care provider. Make sure you discuss any questions you have with your health care provider. Document Revised: 12/27/2016 Document Reviewed: 05/30/2016 Elsevier Patient Education  [place].

## 2020-04-17 ENCOUNTER — Ambulatory Visit (INDEPENDENT_AMBULATORY_CARE_PROVIDER_SITE_OTHER): Payer: 59 | Admitting: Obstetrics & Gynecology

## 2020-04-17 ENCOUNTER — Other Ambulatory Visit: Payer: Self-pay

## 2020-04-17 ENCOUNTER — Encounter: Payer: Self-pay | Admitting: Obstetrics & Gynecology

## 2020-04-17 VITALS — BP 121/79 | HR 89 | Wt 209.5 lb

## 2020-04-17 DIAGNOSIS — Z3403 Encounter for supervision of normal first pregnancy, third trimester: Secondary | ICD-10-CM

## 2020-04-17 NOTE — Progress Notes (Signed)
   LOW-RISK PREGNANCY VISIT Patient name: Angel Diaz MRN [id number]  Date of birth: 03/07/1995 Chief Complaint:   Routine Prenatal Visit  History of Present Illness:   Angel Diaz is a 25 y.o. G69P0000 female at [redacted]w[redacted]d with an Estimated Date of Delivery: 04/23/20 being seen today for ongoing management of a low-risk pregnancy.  Depression screen [facility] 2/9 01/27/2020 10/07/2019 11/10/2017  Decreased Interest 1 2 0  Down, Depressed, Hopeless 0 1 0  PHQ - 2 Score 1 3 0  Altered sleeping 1 0 -  Tired, decreased energy 1 2 -  Change in appetite 0 1 -  Feeling bad or failure about yourself  0 0 -  Trouble concentrating 0 1 -  Moving slowly or fidgety/restless 0 0 -  Suicidal thoughts 0 0 -  PHQ-9 Score 3 7 -    Today she reports no complaints. Contractions: Irregular. Vag. Bleeding: None.  Movement: Present. denies leaking of fluid. Review of Systems:   Pertinent items are noted in HPI Denies abnormal vaginal discharge w/ itching/odor/irritation, headaches, visual changes, shortness of breath, chest pain, abdominal pain, severe nausea/vomiting, or problems with urination or bowel movements unless otherwise stated above. Pertinent History Reviewed:  Reviewed past medical,surgical, social, obstetrical and family history.  Reviewed problem list, medications and allergies. Physical Assessment:   Vitals:   04/17/20 1354  BP: 121/79  Pulse: 89  Weight: 209 lb 8 oz (95 kg)  Body mass index is 34.86 kg/m.        Physical Examination:   General appearance: Well appearing, and in no distress  Mental status: Alert, oriented to person, place, and time  Skin: Warm & dry  Cardiovascular: Normal heart rate noted  Respiratory: Normal respiratory effort, no distress  Abdomen: Soft, gravid, nontender  Pelvic: Cervical exam performed  Dilation: 2 Effacement (%): 70 Station: -2  Extremities: Edema: Trace  Fetal Status: Fetal Heart Rate (bpm): 142 Fundal Height: 38 cm Movement:  Present Presentation: Vertex  Chaperone: Malachy Mood    No results found for this or any previous visit (from the past 24 hour(s)).  Assessment & Plan:  1) Low-risk pregnancy G1P0000 at [redacted]w[redacted]d with an Estimated Date of Delivery: 04/23/20   2) ,    Meds: No orders of the defined types were placed in this encounter.  Labs/procedures today:   Plan:  Continue routine obstetrical care  Next visit: prefers in person    Reviewed: Term labor symptoms and general obstetric precautions including but not limited to vaginal bleeding, contractions, leaking of fluid and fetal movement were reviewed in detail with the patient.  All questions were answered. Has home bp cuff. Rx faxed to . Check bp weekly, let us know if >140/90.   Follow-up: Return in about 1 week (around 04/24/2020) for NST, LROB.  No orders of the defined types were placed in this encounter.   Lazaro Arms, MD 04/17/2020 2:31 PM

## 2020-04-18 ENCOUNTER — Telehealth: Payer: Self-pay

## 2020-04-18 NOTE — Telephone Encounter (Signed)
Pt called c/o moderate pain from back starting after she was bending over a box sorting through things at home. The pain is mostly mid back to one side. She reports that baby movement is normal, no bleeding or spotting, no regular contractions. She state that she took a Tylenol and the pain improved. Instructed pt to increase fluid intake, lay on left side, use a heating pad, and take some additional Tylenol. If these things do not help, or she has regular contractions, any change in baby movement, or leaking fluid, she was instructed to seek immediate care. If she had improvement and wanted to be seen, she could call our office tomorrow. Pt confirmed understanding.

## 2020-04-20 ENCOUNTER — Other Ambulatory Visit: Payer: Self-pay | Admitting: Advanced Practice Midwife

## 2020-04-20 MED ORDER — CYCLOBENZAPRINE HCL 10 MG PO TABS: 10.0000 mg | ORAL_TABLET | Freq: Three times a day (TID) | ORAL | 0 refills | Status: DC | PRN

## 2020-04-20 NOTE — Progress Notes (Signed)
Flexeril for muscle strain

## 2020-04-21 ENCOUNTER — Inpatient Hospital Stay (HOSPITAL_COMMUNITY): Payer: 59 | Admitting: Anesthesiology

## 2020-04-21 ENCOUNTER — Other Ambulatory Visit: Payer: Self-pay

## 2020-04-21 ENCOUNTER — Inpatient Hospital Stay (HOSPITAL_COMMUNITY)
Admission: AD | Admit: 2020-04-21 | Discharge: 2020-04-23 | DRG: 807 | Disposition: A | Discharge status: Home / Self Care | Payer: 59 | Attending: Family Medicine | Admitting: Family Medicine

## 2020-04-21 ENCOUNTER — Encounter (HOSPITAL_COMMUNITY): Payer: Self-pay | Admitting: Obstetrics & Gynecology

## 2020-04-21 DIAGNOSIS — Z20822 Contact with and (suspected) exposure to covid-19: Secondary | ICD-10-CM | POA: Diagnosis present

## 2020-04-21 DIAGNOSIS — Z3A39 39 weeks gestation of pregnancy: Secondary | ICD-10-CM

## 2020-04-21 DIAGNOSIS — O4202 Full-term premature rupture of membranes, onset of labor within 24 hours of rupture: Secondary | ICD-10-CM

## 2020-04-21 DIAGNOSIS — Z3403 Encounter for supervision of normal first pregnancy, third trimester: Secondary | ICD-10-CM

## 2020-04-21 DIAGNOSIS — O3663X0 Maternal care for excessive fetal growth, third trimester, not applicable or unspecified: Secondary | ICD-10-CM | POA: Diagnosis present

## 2020-04-21 DIAGNOSIS — O26893 Other specified pregnancy related conditions, third trimester: Secondary | ICD-10-CM | POA: Diagnosis present

## 2020-04-21 LAB — TYPE AND SCREEN
ABO/RH(D): O POS
Antibody Screen: NEGATIVE

## 2020-04-21 LAB — CBC
HCT: 37.1 % (ref 36.0–46.0)
Hemoglobin: 12 g/dL (ref 12.0–15.0)
MCH: 27.9 pg (ref 26.0–34.0)
MCHC: 32.3 g/dL (ref 30.0–36.0)
MCV: 86.3 fL (ref 80.0–100.0)
Platelets: 338 10*3/uL (ref 150–400)
RBC: 4.3 MIL/uL (ref 3.87–5.11)
RDW: 14.6 % (ref 11.5–15.5)
WBC: 13.7 10*3/uL — ABNORMAL HIGH (ref 4.0–10.5)
nRBC: 0 % (ref 0.0–0.2)

## 2020-04-21 LAB — RPR: RPR Ser Ql: NONREACTIVE

## 2020-04-21 LAB — RESP PANEL BY RT-PCR (FLU A&B, COVID) ARPGX2
Influenza A by PCR: NEGATIVE
Influenza B by PCR: NEGATIVE
SARS Coronavirus 2 by RT PCR: NEGATIVE

## 2020-04-21 MED ORDER — TETANUS-DIPHTH-ACELL PERTUSSIS 5-2.5-18.5 LF-MCG/0.5 IM SUSY: 0.5000 mL | PREFILLED_SYRINGE | Freq: Once | INTRAMUSCULAR | Status: DC

## 2020-04-21 MED ORDER — LACTATED RINGERS IV SOLN: INTRAVENOUS | Status: DC

## 2020-04-21 MED ORDER — PHENYLEPHRINE 40 MCG/ML (10ML) SYRINGE FOR IV PUSH (FOR BLOOD PRESSURE SUPPORT): 80.0000 ug | PREFILLED_SYRINGE | INTRAVENOUS | Status: DC | PRN

## 2020-04-21 MED ORDER — FENTANYL-BUPIVACAINE-NACL 0.5-0.125-0.9 MG/250ML-% EP SOLN
12.0000 mL/h | EPIDURAL | Status: DC | PRN
Start: 2020-04-21 — End: 2020-04-21
  Filled 2020-04-21: qty 250

## 2020-04-21 MED ORDER — OXYTOCIN-SODIUM CHLORIDE 30-0.9 UT/500ML-% IV SOLN
1.0000 m[IU]/min | INTRAVENOUS | Status: DC
  Administered 2020-04-21: 2 m[IU]/min via INTRAVENOUS
  Filled 2020-04-21: qty 500

## 2020-04-21 MED ORDER — TERBUTALINE SULFATE 1 MG/ML IJ SOLN: 0.2500 mg | Freq: Once | INTRAMUSCULAR | Status: DC | PRN

## 2020-04-21 MED ORDER — ONDANSETRON HCL 4 MG PO TABS: 4.0000 mg | ORAL_TABLET | ORAL | Status: DC | PRN

## 2020-04-21 MED ORDER — EPHEDRINE 5 MG/ML INJ: 10.0000 mg | INTRAVENOUS | Status: DC | PRN

## 2020-04-21 MED ORDER — COCONUT OIL OIL
1.0000 "application " | TOPICAL_OIL | Status: DC | PRN
  Administered 2020-04-22: 1 via TOPICAL

## 2020-04-21 MED ORDER — OXYTOCIN BOLUS FROM INFUSION
333.0000 mL | Freq: Once | INTRAVENOUS | Status: AC
  Administered 2020-04-21: 333 mL via INTRAVENOUS

## 2020-04-21 MED ORDER — ZOLPIDEM TARTRATE 5 MG PO TABS
5.0000 mg | ORAL_TABLET | Freq: Every evening | ORAL | Status: DC | PRN
Start: 2020-04-21 — End: 2020-04-23

## 2020-04-21 MED ORDER — SENNOSIDES-DOCUSATE SODIUM 8.6-50 MG PO TABS
2.0000 | ORAL_TABLET | Freq: Every day | ORAL | Status: DC
  Administered 2020-04-22 – 2020-04-23 (×2): 2 via ORAL
  Filled 2020-04-21 (×2): qty 2

## 2020-04-21 MED ORDER — FENTANYL-BUPIVACAINE-NACL 0.5-0.125-0.9 MG/250ML-% EP SOLN
EPIDURAL | Status: DC | PRN
  Administered 2020-04-21: 12 mL/h via EPIDURAL

## 2020-04-21 MED ORDER — LACTATED RINGERS IV SOLN: 500.0000 mL | Freq: Once | INTRAVENOUS | Status: DC

## 2020-04-21 MED ORDER — MEDROXYPROGESTERONE ACETATE 150 MG/ML IM SUSP: 150.0000 mg | INTRAMUSCULAR | Status: DC | PRN

## 2020-04-21 MED ORDER — ONDANSETRON HCL 4 MG/2ML IJ SOLN: 4.0000 mg | INTRAMUSCULAR | Status: DC | PRN

## 2020-04-21 MED ORDER — WITCH HAZEL-GLYCERIN EX PADS
1.0000 "application " | MEDICATED_PAD | CUTANEOUS | Status: DC | PRN
  Administered 2020-04-22 – 2020-04-23 (×2): 1 via TOPICAL

## 2020-04-21 MED ORDER — LIDOCAINE HCL (PF) 1 % IJ SOLN
INTRAMUSCULAR | Status: DC | PRN
  Administered 2020-04-21 (×2): 4 mL via EPIDURAL

## 2020-04-21 MED ORDER — SIMETHICONE 80 MG PO CHEW: 80.0000 mg | CHEWABLE_TABLET | ORAL | Status: DC | PRN

## 2020-04-21 MED ORDER — ONDANSETRON HCL 4 MG/2ML IJ SOLN: 4.0000 mg | Freq: Four times a day (QID) | INTRAMUSCULAR | Status: DC | PRN

## 2020-04-21 MED ORDER — FERROUS SULFATE 325 (65 FE) MG PO TABS
325.0000 mg | ORAL_TABLET | ORAL | Status: DC
  Administered 2020-04-22: 325 mg via ORAL
  Filled 2020-04-21: qty 1

## 2020-04-21 MED ORDER — IBUPROFEN 600 MG PO TABS
600.0000 mg | ORAL_TABLET | Freq: Four times a day (QID) | ORAL | Status: DC
  Administered 2020-04-21 – 2020-04-23 (×6): 600 mg via ORAL
  Filled 2020-04-21 (×7): qty 1

## 2020-04-21 MED ORDER — OXYTOCIN-SODIUM CHLORIDE 30-0.9 UT/500ML-% IV SOLN
2.5000 [IU]/h | INTRAVENOUS | Status: DC
  Administered 2020-04-21: 2.5 [IU]/h via INTRAVENOUS

## 2020-04-21 MED ORDER — DIPHENHYDRAMINE HCL 50 MG/ML IJ SOLN: 12.5000 mg | INTRAMUSCULAR | Status: DC | PRN

## 2020-04-21 MED ORDER — PRENATAL MULTIVITAMIN CH
1.0000 | ORAL_TABLET | Freq: Every day | ORAL | Status: DC
  Administered 2020-04-22 – 2020-04-23 (×2): 1 via ORAL
  Filled 2020-04-21 (×2): qty 1

## 2020-04-21 MED ORDER — OXYCODONE-ACETAMINOPHEN 5-325 MG PO TABS: 2.0000 | ORAL_TABLET | ORAL | Status: DC | PRN

## 2020-04-21 MED ORDER — DIBUCAINE (PERIANAL) 1 % EX OINT
1.0000 "application " | TOPICAL_OINTMENT | CUTANEOUS | Status: DC | PRN
  Administered 2020-04-22: 1 via RECTAL
  Filled 2020-04-21: qty 28

## 2020-04-21 MED ORDER — LACTATED RINGERS IV SOLN
500.0000 mL | Freq: Once | INTRAVENOUS | Status: AC
  Administered 2020-04-21: 500 mL via INTRAVENOUS

## 2020-04-21 MED ORDER — FENTANYL CITRATE (PF) 100 MCG/2ML IJ SOLN
100.0000 ug | INTRAMUSCULAR | Status: DC | PRN
Start: 2020-04-21 — End: 2020-04-23
  Administered 2020-04-21 (×2): 100 ug via INTRAVENOUS
  Filled 2020-04-21 (×2): qty 2

## 2020-04-21 MED ORDER — BENZOCAINE-MENTHOL 20-0.5 % EX AERO
1.0000 "application " | INHALATION_SPRAY | CUTANEOUS | Status: DC | PRN
  Administered 2020-04-21 – 2020-04-23 (×2): 1 via TOPICAL
  Filled 2020-04-21 (×2): qty 56

## 2020-04-21 MED ORDER — LACTATED RINGERS IV SOLN
500.0000 mL | INTRAVENOUS | Status: DC | PRN
  Administered 2020-04-21: 500 mL via INTRAVENOUS

## 2020-04-21 MED ORDER — ACETAMINOPHEN 325 MG PO TABS
650.0000 mg | ORAL_TABLET | ORAL | Status: DC | PRN
  Administered 2020-04-22: 650 mg via ORAL
  Filled 2020-04-21: qty 2

## 2020-04-21 MED ORDER — DIPHENHYDRAMINE HCL 25 MG PO CAPS: 25.0000 mg | ORAL_CAPSULE | Freq: Four times a day (QID) | ORAL | Status: DC | PRN

## 2020-04-21 MED ORDER — OXYCODONE-ACETAMINOPHEN 5-325 MG PO TABS: 1.0000 | ORAL_TABLET | ORAL | Status: DC | PRN

## 2020-04-21 MED ORDER — ACETAMINOPHEN 325 MG PO TABS: 650.0000 mg | ORAL_TABLET | ORAL | Status: DC | PRN

## 2020-04-21 MED ORDER — SOD CITRATE-CITRIC ACID 500-334 MG/5ML PO SOLN: 30.0000 mL | ORAL | Status: DC | PRN

## 2020-04-21 MED ORDER — LIDOCAINE HCL (PF) 1 % IJ SOLN
30.0000 mL | INTRAMUSCULAR | Status: DC | PRN
  Filled 2020-04-21: qty 30

## 2020-04-21 NOTE — Progress Notes (Incomplete)
LABOR PROGRESS NOTE  Angel Diaz is a 25 y.o. G1P0000 at [redacted]w[redacted]d admitted for spontaneous onset of labor.  Subjective: Feeling anxious  Objective: BP 124/88   Pulse (!) 109   Temp 98.2 F (36.8 C) (Oral)   Resp 16   Ht 5\' 5"  (1.651 m)   Wt 94.9 kg   LMP 07/18/2019   SpO2 97%   BMI 34.81 kg/m  or  Vitals:   04/21/20 1531 04/21/20 1602 04/21/20 1701 04/21/20 1706  BP: 95/61 (!) 138/93 124/88   Pulse: 96 (!) 115 (!) 109   Resp:      Temp:    98.2 F (36.8 C)  TempSrc:    Oral  SpO2:   97%   Weight:      Height:        Dilation: 9 Effacement (%): 90 Station: 0 Presentation: Vertex Exam by:: Dr. [email or web address] FHT: baseline rate 145, moderate variability, positive acels, single five minute prolonged decel  Toco: q2-5 min  Labs: Lab Results  Component Value Date   WBC 13.7 (H) 04/21/2020   HGB 12.0 04/21/2020   HCT 37.1 04/21/2020   MCV 86.3 04/21/2020   PLT 338 04/21/2020    Patient Active Problem List   Diagnosis Date Noted  . Indication for care in labor or delivery 04/21/2020  . Supervision of normal first pregnancy 10/06/2019    Assessment / Plan: 25 y.o. G1P0000 at [redacted]w[redacted]d here for spontaneous onset of labor.  Labor:  AROM at 1145, IUPC placed and pitocin started at 1345 for minimal change, adequate with pitocin for most of afternoon. Prolonged decel at 1600, pitocin stopped, patient repositioned with subsequent improvement. Restart pitocin at this time. Will monitor closely.     LGA: EFW 98%ile at [redacted]w[redacted]d, no u/s since then, 2 hr gtt normal, patient BMI 34. Leopold's ~8lbs today. SD precautions at delivery. Fetal Wellbeing:  Cat I strip Pain Control:  Epidural Anticipated MOD:  Vaginal    [redacted]w[redacted]d, MD OB Family Medicine Fellow, [facility] for [facility], [facility]

## 2020-04-21 NOTE — Progress Notes (Signed)
LABOR PROGRESS NOTE  Angel Diaz is a 25 y.o. G1P0000 at [redacted]w[redacted]d admitted for spontaneous onset of labor.  Subjective: Patient comfortable with epidural.  Objective: BP 120/80   Pulse 100   Temp 98.3 F (36.8 C) (Axillary)   Resp 17   Ht 5\' 5"  (1.651 m)   Wt 94.9 kg   LMP 07/18/2019   SpO2 98%   BMI 34.81 kg/m  or  Vitals:   04/21/20 0930 04/21/20 1000 04/21/20 1030 04/21/20 1100  BP: 102/63 119/87 129/85 120/80  Pulse: 96 88 (!) 101 100  Resp:    17  Temp:    98.3 F (36.8 C)  TempSrc:    Axillary  SpO2:      Weight:      Height:        Dilation: 8 Effacement (%): 90 Station: -1 Presentation: Vertex Exam by:: Dr. [email or web address] FHT: baseline rate 140, moderate variability, positive acels, no decels Toco: q2-5 min  Labs: Lab Results  Component Value Date   WBC 13.7 (H) 04/21/2020   HGB 12.0 04/21/2020   HCT 37.1 04/21/2020   MCV 86.3 04/21/2020   PLT 338 04/21/2020    Patient Active Problem List   Diagnosis Date Noted  . Indication for care in labor or delivery 04/21/2020  . Supervision of normal first pregnancy 10/06/2019    Assessment / Plan: 25 y.o. G1P0000 at [redacted]w[redacted]d here for spontaneous onset of labor.  Labor: Has progressed appropriately without augmentation thus far, but is minimally changed from last check (now 8/90/-1 from 7.5/90/1). AROM performed with this cervical exam for clear fluid. LGA: EFW 98%ile at [redacted]w[redacted]d, no u/s since then, 2 hr gtt normal, patient BMI 34. Leopold's ~8lbs today. SD precautions at delivery. Fetal Wellbeing:  Cat I strip Pain Control:  Epidural Anticipated MOD:  Vaginal   [redacted]w[redacted]d, MD PGY-1 [facility] 04/21/2020, 11:40 AM

## 2020-04-21 NOTE — MAU Note (Signed)
PT SAYS SHE HAD BLOOD WHEN SHE WIPED.  IN OFFICE- DR Despina Hidden -ON Monday - VE 2 CM.  STRONG UC SINCE 1030PM.  DENIES HSV AND MRSA.  GBS- NEG

## 2020-04-21 NOTE — Anesthesia Preprocedure Evaluation (Signed)
Anesthesia Evaluation  Patient identified by MRN, date of birth, ID band Patient awake    Reviewed: Allergy & Precautions, Patient's Chart, lab work & pertinent test results  History of Anesthesia Complications Negative for: history of anesthetic complications  Airway Mallampati: II  TM Distance: >3 FB Neck ROM: Full    Dental no notable dental hx.    Pulmonary neg pulmonary ROS,    Pulmonary exam normal        Cardiovascular negative cardio ROS Normal cardiovascular exam     Neuro/Psych negative neurological ROS  negative psych ROS   GI/Hepatic negative GI ROS, Neg liver ROS,   Endo/Other  negative endocrine ROS  Renal/GU negative Renal ROS  negative genitourinary   Musculoskeletal negative musculoskeletal ROS (+)   Abdominal   Peds  Hematology negative hematology ROS (+)   Anesthesia Other Findings Day of surgery medications reviewed with patient.  Reproductive/Obstetrics (+) Pregnancy                             Anesthesia Physical Anesthesia Plan  ASA: II  Anesthesia Plan: Epidural   Post-op Pain Management:    Induction:   PONV Risk Score and Plan: Treatment may vary due to age or medical condition  Airway Management Planned: Natural Airway  Additional Equipment:   Intra-op Plan:   Post-operative Plan:   Informed Consent: I have reviewed the patients History and Physical, chart, labs and discussed the procedure including the risks, benefits and alternatives for the proposed anesthesia with the patient or authorized representative who has indicated his/her understanding and acceptance.       Plan Discussed with: CRNA  Anesthesia Plan Comments:         Anesthesia Quick Evaluation  

## 2020-04-21 NOTE — Discharge Summary (Signed)
Postpartum Discharge Summary     Patient Name: Angel Diaz DOB: 26-Sep-1995 MRN: [id number]  Date of admission: 04/21/2020 Delivery date:04/21/2020  Delivering provider: Janet Berlin  Date of discharge: 04/23/2020  Admitting diagnosis: Indication for care in labor or delivery [O75.9] Intrauterine pregnancy: [redacted]w[redacted]d    Secondary diagnosis:  Active Problems:   Indication for care in labor or delivery   Vaginal delivery  Additional problems: None    Discharge diagnosis: Term Pregnancy Delivered                                              Post partum procedures:none Augmentation: AROM and Pitocin Complications: None  Hospital course: Onset of Labor With Vaginal Delivery      25y.o. yo G1P0000 at 33w5das admitted in Latent Labor on 04/21/2020. Patient had an uncomplicated labor course as follows:  Membrane Rupture Time/Date: 11:34 AM ,04/21/2020   Delivery Method:Vaginal, Spontaneous  Episiotomy: None  Lacerations:  2nd degree  Patient had an uncomplicated postpartum course.  She is ambulating, tolerating a regular diet, passing flatus, and urinating well. Patient is discharged home in stable condition on 04/23/20.  Newborn Data: Birth date:04/21/2020  Birth time:8:26 PM  Gender:Female  Living status:Living  Apgars:8 ,9  Weight:3680 g   Magnesium Sulfate received: No BMZ received: No Rhophylac:N/A MMR:N/A T-DaP:Given prenatally Flu: Yes Transfusion:No  Physical exam  Vitals:   04/22/20 0846 04/22/20 1501 04/22/20 1934 04/23/20 0503  BP: 111/72 121/81 124/76 117/79  Pulse: 77 85 95 72  Resp: _0 Temp: 98.7 F (37.1 C) 99.2 F (37.3 C) 99.8 F (37.7 C) 98 F (36.7 C)  TempSrc:  Oral Oral Oral  SpO2:  96% 99% 100%  Weight:      Height:       General: alert, cooperative and no distress Lochia: appropriate Uterine Fundus: firm Incision: N/A DVT Evaluation: No evidence of DVT seen on physical exam. Labs: Lab Results  Component Value Date    WBC 13.7 (H) 04/21/2020   HGB 12.0 04/21/2020   HCT 37.1 04/21/2020   MCV 86.3 04/21/2020   PLT 338 04/21/2020   No flowsheet data found. Edinburgh Score: Edinburgh Postnatal Depression Scale Screening Tool 04/21/2020  I have been able to laugh and see the funny side of things. 0  I have looked forward with enjoyment to things. 0  I have blamed myself unnecessarily when things went wrong. 1  I have been anxious or worried for no good reason. 1  I have felt scared or panicky for no good reason. 0  Things have been getting on top of me. 0  I have been so unhappy that I have had difficulty sleeping. 0  I have felt sad or miserable. 0  I have been so unhappy that I have been crying. 0  The thought of harming myself has occurred to me. 0  Edinburgh Postnatal Depression Scale Total 2     After visit meds:  Allergies as of 04/23/2020      Reactions   Sulfa Antibiotics Rash      Medication List    TAKE these medications   acetaminophen 325 MG tablet Commonly known as: TYLENOL Take 650 mg by mouth every 6 (six) hours as needed for mild pain or headache.   coconut oil Oil Apply 1 application topically as  needed.   cyclobenzaprine 10 MG tablet Commonly known as: FLEXERIL Take 1 tablet (10 mg total) by mouth every 8 (eight) hours as needed for muscle spasms.   ibuprofen 600 MG tablet Commonly known as: ADVIL Take 1 tablet (600 mg total) by mouth every 6 (six) hours.   prenatal multivitamin Tabs tablet Take 1 tablet by mouth daily at 12 noon.       Discharge home in stable condition Infant Feeding: Breast Infant Disposition:home with mother Discharge instruction: per After Visit Summary and Postpartum booklet. Activity: Advance as tolerated. Pelvic rest for 6 weeks.  Diet: routine diet Future Appointments:No future appointments. Follow up Visit:  Follow-up Information    [facility] Follow up in 6 week(s).   Why: Or sooner as needed, return to MAU for  emergencies Contact information: [place] [address] [phone]               Please schedule this patient for a In person postpartum visit in 6 weeks with the following provider: Any provider. Additional Postpartum F/U:none  Low risk pregnancy complicated by: uncomplicated Delivery mode:  Vaginal, Spontaneous  Anticipated Birth Control:  Condoms   Midwife attestation I have seen and examined this patient and agree with above documentation in the resident's note.   Halla Chopp is a 25 y.o. G1P1001 s/p vaginal delivery.   Pain is well controlled.  Plan for birth control is condoms.  Method of Feeding: breast  PE:  BP 117/79 (BP Location: Right Arm)   Pulse 72   Temp 98 F (36.7 C) (Oral)   Resp 18   Ht _0  (1.651 m)   Wt 94.9 kg   LMP 07/18/2019   SpO2 100%   Breastfeeding Unknown   BMI 34.81 kg/m  Gen: well appearing Heart: reg rate Lungs: normal WOB Fundus firm Ext: soft, no pain, no edema  Recent Labs    04/21/20 0415  HGB 12.0  HCT 37.1     Plan: discharge today - postpartum care discussed --Pt with hx urinary retention, to notify FT if this is a problem, refer to PT. Reviewed reasons to seek emergency care. - f/u clinic in 6 weeks for postpartum visit   Fatima Blank, CNM 9:43 AM

## 2020-04-21 NOTE — H&P (Addendum)
OBSTETRIC ADMISSION HISTORY AND PHYSICAL  Angel Diaz is a 25 y.o. female G1P0000 with IUP at 33w5dby 11 wk u/s presenting for spontaneous onset of labor She reports +FMs, No LOF, no VB, no blurry vision, headaches or peripheral edema, and RUQ pain.  She plans on breast feeding. She request condoms for birth control. [facility]  Dating: By 11 wk u/s --->  Estimated Date of Delivery: 04/23/20  Sono:    @[redacted]w[redacted]d , CWD, normal anatomy, cephalic presentation, anterior placenta, 386g, 98% EFW   Prenatal History/Complications: None   Past Medical History: Past Medical History:  Diagnosis Date   UTI (urinary tract infection)     Past Surgical History: Past Surgical History:  Procedure Laterality Date   SPINE SURGERY     had growth at end of spine had removed after birth     Obstetrical History: OB History     Gravida  1   Para  0   Term  0   Preterm  0   AB  0   Living  0      SAB  0   IAB  0   Ectopic  0   Multiple  0   Live Births  0           Social History Social History   Socioeconomic History   Marital status: Married    Spouse name: DJessel Gettinger  Number of children: Not on file   Years of education: Not on file   Highest education level: Not on file  Occupational History   Not on file  Tobacco Use   Smoking status: Never Smoker   Smokeless tobacco: Never Used  Vaping Use   Vaping Use: Never used  Substance and Sexual Activity   Alcohol use: Not Currently    Comment: rarely   Drug use: Never   Sexual activity: Yes    Birth control/protection: None  Other Topics Concern   Not on file  Social History Narrative   Not on file   Social Determinants of Health   Financial Resource Strain: Low Risk    Difficulty of Paying Living Expenses: Not very hard  Food Insecurity: No Food Insecurity   Worried About Running Out of Food in the Last Year: Never true   RMetamorain the Last Year:  Never true  Transportation Needs: No Transportation Needs   Lack of Transportation (Medical): No   Lack of Transportation (Non-Medical): No  Physical Activity: Sufficiently Active   Days of Exercise per Week: 3 days   Minutes of Exercise per Session: 50 min  Stress: No Stress Concern Present   Feeling of Stress : Only a little  Social Connections: SE[employer], building servicesof Communication with Friends and Family: More than three times a week   Frequency of Social Gatherings with Friends and Family: Once a week   Attends Religious Services: More than 4 times per year   Active Member of [employer] Organizations: Yes   Attends CMusic therapist More than 4 times per year   Marital Status: Married    Family History: Family History  Problem Relation Age of Onset   Heart disease Paternal Grandmother    Diabetes Father    Cancer Cousin        breast    Allergies: Allergies  Allergen Reactions   Sulfa Antibiotics Rash    Medications Prior to Admission  Medication  Sig Dispense Refill Last Dose   cyclobenzaprine (FLEXERIL) 10 MG tablet Take 1 tablet (10 mg total) by mouth every 8 (eight) hours as needed for muscle spasms. 30 tablet 0 04/20/2020 at Unknown time   Prenatal Vit-Fe Fumarate-FA (PRENATAL VITAMIN PO) Take by mouth daily.   04/20/2020 at Unknown time     Review of Systems   All systems reviewed and negative except as stated in HPI  Blood pressure 127/83, pulse (!) 111, temperature 98.3 F (36.8 C), temperature source Oral, resp. rate 20, height 5' 5"  (1.651 m), weight 94.9 kg, last menstrual period 07/18/2019. General appearance: alert, cooperative and mild distress Lungs: clear to auscultation bilaterally Heart: regular rate and rhythm Abdomen: soft, non-tender; bowel sounds normal Extremities: Homans sign is negative, no sign of DVT Presentation: cephalic Fetal monitoringBaseline: 145 bpm, Variability: Good {> 6 bpm), Accelerations: Reactive and  Decelerations: Absent Uterine activityFrequency: Every 5 minutes Dilation: 5.5 Effacement (%): 80 Station: -2 Exam by:: Carmelia Roller, CNM   Prenatal labs: ABO, Rh: O/Positive/-- (09/09 1234) Antibody: Negative (12/30 0829) Rubella: 5.79 (09/09 1234) RPR: Non Reactive (12/30 0829)  HBsAg: Negative (09/09 1234)  HIV: Non Reactive (12/30 0829)  GBS: Negative/-- (03/02 1400)  1 hr Glucola Normal  Genetic screening  Normal  Anatomy US Normal   Prenatal Transfer Tool  Maternal Diabetes: No Genetic Screening: Normal Maternal Ultrasounds/Referrals: Normal Fetal Ultrasounds or other Referrals:  None Maternal Substance Abuse:  No Significant Maternal Medications:  None Significant Maternal Lab Results: Group B Strep negative  No results found for this or any previous visit (from the past 24 hour(s)).  Patient Active Problem List   Diagnosis Date Noted   Supervision of normal first pregnancy 10/06/2019    Assessment/Plan:  Angel Diaz is a 25 y.o. G1P0000 at 34w5dhere for onset of labor  #Labor: Patient is progressing well with no augmentation at this time.  We will continue to monitor and recheck her cervix in 4 hours for further progression.  If there is no further dilation and her contractions are not more regular we can augment with Pitocin if needed. #Pain: IVP and epidural PRN #FWB: Category 1, reassuring #ID:  GBS negative #MOF: Bottlefeeding #MOC: Condoms #Circ:  Yes  VGifford Shave MD  04/21/2020, 4:22 AM  I personally saw and evaluated the patient, performing the key elements of the service. I developed and verified the management plan that is described in the resident's/student's note, and I agree with the content with my edits above. VSS, HRR&R, Resp unlabored, Legs neg.  FNigel Berthold CNM 04/21/2020 6:33 AM   Attestation of Attending Supervision of Advanced Practice Provider (PA/CNM/NP): Evaluation and management procedures were performed  by the Advanced Practice Provider under my supervision and collaboration.  I have reviewed the Advanced Practice Provider's note and chart, and I agree with the management and plan. I have also made any necessary editorial changes.   JAnnalee Genta DO Attending OPopponesset [facility] 04/21/2020 6:54 AM

## 2020-04-21 NOTE — Anesthesia Procedure Notes (Signed)
Epidural Patient location during procedure: OB Start time: 04/21/2020 8:30 AM End time: 04/21/2020 8:33 AM  Staffing Anesthesiologist: Kaylyn Layer, MD Performed: anesthesiologist   Preanesthetic Checklist Completed: patient identified, IV checked, risks and benefits discussed, monitors and equipment checked, pre-op evaluation and timeout performed  Epidural Patient position: sitting Prep: DuraPrep and site prepped and draped Patient monitoring: continuous pulse ox, blood pressure and heart rate Approach: midline Location: L3-L4 Injection technique: LOR air  Needle:  Needle type: Tuohy  Needle gauge: 17 G Needle length: 9 cm Needle insertion depth: 7 cm Catheter type: closed end flexible Catheter size: 19 Gauge Catheter at skin depth: 12 cm Test dose: negative and Other (1% lidocaine)  Assessment Events: blood not aspirated, injection not painful, no injection resistance, no paresthesia and negative IV test  Additional Notes Patient identified. Risks, benefits, and alternatives discussed with patient including but not limited to bleeding, infection, nerve damage, paralysis, failed block, incomplete pain control, headache, blood pressure changes, nausea, vomiting, reactions to medication, itching, and postpartum back pain. Confirmed with bedside nurse the patient's most recent platelet count. Confirmed with patient that they are not currently taking any anticoagulation, have any bleeding history, or any family history of bleeding disorders. Patient expressed understanding and wished to proceed. All questions were answered. Sterile technique was used throughout the entire procedure. Please see nursing notes for vital signs.   Crisp LOR on first pass. Test dose was given through epidural catheter and negative prior to continuing to dose epidural or start infusion. Warning signs of high block given to the patient including shortness of breath, tingling/numbness in hands, complete  motor block, or any concerning symptoms with instructions to call for help. Patient was given instructions on fall risk and not to get out of bed. All questions and concerns addressed with instructions to call with any issues or inadequate analgesia.  Reason for block:procedure for pain

## 2020-04-21 NOTE — Lactation Note (Signed)
This note was copied from a baby's chart. Lactation Consultation Note  Patient Name: Angel Diaz [id number] Date: 04/21/2020 Reason for consult: L&D Initial assessment;Term;Primapara;1st time breastfeeding Age:26 hours   Visited with mom of 1 hour old FT female, she's a P1 and reported (+) breast changes during the pregnancy. LC assisted with hand expression but no colostrum noted at this point, even though mom reported she's seen it during the last few weeks of the pregnancy.  LC also assisted with latching baby to the right breast in cross cradle position but baby is still transitioning and not able to latch; he kept opening his mouth wide, but would not suck at the breast. Left mom and baby doing STS.   Reviewed normal newborn behavior, cluster feeding, feeding cues, size of baby's stomach and pacifier use (mom asked about it). Parents aware we do have donor milk in the unit in case mom is not able to get colostrum.  Feeding plan:  1. Encouraged mom to feed baby STS 8-12 times/24 hours or sooner if feeding cues are present 2. Hand expression and spoon/finger feeding were also encouraged once mom starts getting some EBM  No literature provided due to the nature of this L&D consultation. FOB present and supportive. Parents reported all questions and concerns were answered, they're both aware of LC OP services and will call PRN.   Maternal Data Has patient been taught Hand Expression?: Yes Does the patient have breastfeeding experience prior to this delivery?: No  Feeding Mother's Current Feeding Choice: Breast Milk  LATCH Score Latch: Repeated attempts needed to sustain latch, nipple held in mouth throughout feeding, stimulation needed to elicit sucking reflex.  Audible Swallowing: None  Type of Nipple: Everted at rest and after stimulation  Comfort (Breast/Nipple): Soft / non-tender  Hold (Positioning): Assistance needed to correctly position infant at breast and maintain  latch.  LATCH Score: 6   Lactation Tools Discussed/Used    Interventions Interventions: Breast feeding basics reviewed;Breast massage;Hand express;Assisted with latch;Skin to skin;Breast compression;Adjust position  Discharge Pump: Personal ([place]akka and DEBP at home) [facility] Program: No  Consult Status Consult Status: Follow-up Date: 04/22/20 Follow-up type: In-patient    Milagros Venetia Constable 04/21/2020, 9:50 PM

## 2020-04-21 NOTE — Progress Notes (Signed)
LABOR PROGRESS NOTE  Angel Diaz is a 25 y.o. G1P0000 at [redacted]w[redacted]d admitted for spontaneous onset of labor.  Subjective: Not feeling anything   Objective: BP (!) 104/59   Pulse 94   Temp 98.6 F (37 C) (Oral)   Resp 17   Ht 5\' 5"  (1.651 m)   Wt 94.9 kg   LMP 07/18/2019   SpO2 98%   BMI 34.81 kg/m  or  Vitals:   04/21/20 1230 04/21/20 1249 04/21/20 1301 04/21/20 1330  BP: 117/79  99/62 (!) 104/59  Pulse: 85  (!) 108 94  Resp:   17   Temp:  98.6 F (37 C)    TempSrc:  Oral    SpO2:      Weight:      Height:        Dilation: 8 Effacement (%): 90 Station: -1 Presentation: Vertex Exam by:: Dr. [email or web address] FHT: baseline rate 145, moderate variability, positive acels, no decels Toco: q2-5 min  Labs: Lab Results  Component Value Date   WBC 13.7 (H) 04/21/2020   HGB 12.0 04/21/2020   HCT 37.1 04/21/2020   MCV 86.3 04/21/2020   PLT 338 04/21/2020    Patient Active Problem List   Diagnosis Date Noted  . Indication for care in labor or delivery 04/21/2020  . Supervision of normal first pregnancy 10/06/2019    Assessment / Plan: 25 y.o. G1P0000 at [redacted]w[redacted]d here for spontaneous onset of labor.  Labor:  AROM at 1145, has not made any cervical change or fetal descent. IUPC placed and Pitocin started   LGA: EFW 98%ile at [redacted]w[redacted]d, no u/s since then, 2 hr gtt normal, patient BMI 34. Leopold's ~8lbs today. SD precautions at delivery. Fetal Wellbeing:  Cat I strip Pain Control:  Epidural Anticipated MOD:  Vaginal    [redacted]w[redacted]d, MD OB Family Medicine Fellow, [facility] for [facility], [facility]

## 2020-04-22 NOTE — Lactation Note (Signed)
This note was copied from a baby's chart. Lactation Consultation Note  Patient Name: Angel Diaz [id number] Date: 04/22/2020 Reason for consult: Follow-up assessment Age:26 hours  Called to the room by RN to assist with latch. Mom holding baby skin to skin, states baby last fed ~4pm, attempted to feed at 7pm but baby would not latch.   Student LC conducted oral assessment with gloved finger, assisted with latching to left and right breast football hold. LC assisted with latching baby to left breast laidback, breast tissue movement and wide angle noted, no audible swallows noted. 43ml of DBM fed to baby via 5 french tube and syringe, baby consumed 46ml before unlatching. LC assisted dad with latching baby to right breast laidback, wide angle noted, baby completed remaining 32ml, tolerated well. Mom held baby skin to skin. Mom reports concerns of not having colostrum, LC unsuccessful at hand expressing colostrum. Mom set up with DEBP, advised mom to pump after every feeding.  Reinforced cue based feeding, expect 8-12 feedings every 24hrs, cluster feeding, supplementation volume for next 24hrs, avoid affected area on head during breast feeds, skin to skin, milk storage guidelines. Mom voiced understanding and with no further concerns. Left the room with mom holding sleeping baby skin to skin. LC updated [name], RN of plan. BGilliam, RN, IBCLC   Feeding Mother's Current Feeding Choice: Breast Milk  LATCH Score Latch: Repeated attempts needed to sustain latch, nipple held in mouth throughout feeding, stimulation needed to elicit sucking reflex.  Audible Swallowing: None  Type of Nipple: Everted at rest and after stimulation (short)  Comfort (Breast/Nipple): Filling, red/small blisters or bruises, mild/mod discomfort (discoloration left nipple)  Hold (Positioning): Full assist, staff holds infant at breast  LATCH Score: 4   Lactation Tools Discussed/Used Tools:  (extra 5 Fr & syringe  provided)  Interventions Interventions: Breast feeding basics reviewed;Assisted with latch;Skin to skin;Breast massage;Hand express;Pre-pump if needed;Breast compression;Adjust position;Support pillows;Position options;Hand pump;DEBP  Discharge    Consult Status Consult Status: Follow-up Date: 04/23/20 Follow-up type: In-patient    Angel Diaz 04/22/2020, 10:26 PM

## 2020-04-22 NOTE — Lactation Note (Signed)
This note was copied from a baby's chart. Lactation Consultation Note  Patient Name: Boy Sameera Betton [id number] Date: 04/22/2020 Reason for consult: Follow-up assessment Age:26 hours  Mom had called out earlier for assist. Infant really tried at the breast with the nipple shield. Mom says "[place]" was able to suckle the nipple shield, but she was unsure if he was able to obtain anything. Per Dad, the most recent feeding likely only lasted 3-5 minutes.   Per charting & Mom's description, there is no indication that infant has truly fed since birth. Mom offered that she was not against supplementation. Mom chose PDM.  On initial exam, infant does hold tongue in the back part of his mouth which takes a heart-shaped tongue on extension. Infant did very well with cup feeding, fully extending his tongue (which I noted to extend in a downward motion).   I spoke to Mom about pumping while here. She has a Spectra pump at home, but lives in [place]. B/c of insurance coverage reasons, she may opt not to use a DEBP while here & instead, use a hand pump. I encouraged Mom to eat and get some sleep.   Parents know to call for me when they'd like for me to return. I did provide a size 21 flange for Mom's hand pump.    Lurline Hare [facility] 04/22/2020, 1:36 PM

## 2020-04-22 NOTE — Lactation Note (Signed)
This note was copied from a baby's chart. Lactation Consultation Note  Patient Name: Angel Diaz [id number] Date: 04/22/2020 Reason for consult: Follow-up assessment;Term Age:26 hours  Infant would not latch with the size 20 nipple shield, even when prefilled with a little bit of DBM.   Mom was agreeable to latch with Mom on her side. Using the teacup hold, "Angel Diaz" latched to the bare breast with relative ease. DBM was added after a few minutes b/c of lack of swallows (verified by cervical auscultation).   Angel Diaz did well with being supplemented at the breast. Dad was shown how to wash the syringe & 5 Fr tubing. Parents know they can call for me again, if needed.   [name], RN, was given an update.  Lurline Hare [facility] 04/22/2020, 5:17 PM

## 2020-04-22 NOTE — Anesthesia Postprocedure Evaluation (Signed)
Anesthesia Post Note  Patient: Angel Diaz  Procedure(s) Performed: AN AD HOC LABOR EPIDURAL     Patient location during evaluation: Mother Baby Anesthesia Type: Epidural Level of consciousness: awake and alert Pain management: pain level controlled Vital Signs Assessment: post-procedure vital signs reviewed and stable Respiratory status: spontaneous breathing Cardiovascular status: stable Postop Assessment: no headache, adequate PO intake, no backache, patient able to bend at knees, able to ambulate, epidural receding and no apparent nausea or vomiting Anesthetic complications: no   No complications documented.  Last Vitals:  Vitals:   04/22/20 0530 04/22/20 0846  BP: 101/62 111/72  Pulse: 85 77  Resp: 18 20  Temp: 36.6 C 37.1 C  SpO2: 99%     Last Pain:  Vitals:   04/22/20 0720  TempSrc:   PainSc: 0-No pain   Pain Goal:                   Salome Arnt

## 2020-04-22 NOTE — Progress Notes (Addendum)
POSTPARTUM PROGRESS NOTE  Subjective: Angel Diaz is a 25 y.o. G1P1001 on PPD#1 s/p SVD at [redacted]w[redacted]d.  She reports she's doing well. No acute events overnight. She denies any problems with ambulating, voiding or po intake. Denies nausea or vomiting. Pain is well controlled.  Lochia is moderate.  Objective: Blood pressure 101/62, pulse 85, temperature 97.9 F (36.6 C), temperature source Oral, resp. rate 18, height 5\' 5"  (1.651 m), weight 94.9 kg, last menstrual period 07/18/2019, SpO2 99 %, unknown if currently breastfeeding.  Physical Exam:  General: alert, cooperative and no distress Chest: no respiratory distress Abdomen: soft, appropriately tender Uterine Fundus: firm and at level of umbilicus Extremities: No calf swelling or tenderness  Recent Labs    04/21/20 0415  HGB 12.0  HCT 37.1    Assessment/Plan: Angel Diaz is a 25 y.o. G1P1001 on PPD#1 s/p SVD at [redacted]w[redacted]d.  Routine Postpartum Care: Doing well, pain well-controlled.  -- Continue routine care -- Contraception: condoms -- Feeding: breast, lactation support prn  Dispo: Plan for discharge tomorrow.  [redacted]w[redacted]d, MD PGY-1 Family Medicine 04/22/2020 7:49 AM  Attestation of Supervision of Student:  I confirm that I have verified the information documented in the  resident's   note and that I have also personally reperformed the history, physical exam and all medical decision making activities.  I have verified that all services and findings are accurately documented in this student's note; and I agree with management and plan as outlined in the documentation. I have also made any necessary editorial changes.  04/24/2020, MD [facility] for [facility], [facility] 04/22/2020 10:04 AM

## 2020-04-23 ENCOUNTER — Inpatient Hospital Stay (HOSPITAL_COMMUNITY): Admit: 2020-04-23 | Payer: Self-pay

## 2020-04-23 MED ORDER — IBUPROFEN 600 MG PO TABS: 600.0000 mg | ORAL_TABLET | Freq: Four times a day (QID) | ORAL | 0 refills | Status: DC

## 2020-04-23 MED ORDER — COCONUT OIL OIL: 1.0000 "application " | TOPICAL_OIL | 0 refills | Status: DC | PRN

## 2020-04-23 NOTE — Lactation Note (Signed)
This note was copied from a baby's chart. Lactation Consultation Note  Patient Name: Boy Brieann Osinski [id number] Date: 04/23/2020 Reason for consult: Follow-up assessment;Primapara;Term;1st time breastfeeding Age:26 hours   P1 mother whose infant is now 54 hours old.  This is a term baby at 39+5 weeks.  Mother is breast feeding and supplementing with donor breast milk.  Baby fussy when I arrived; father changing him.  Void/stool noted.  Offered to assist with latching and mother agreeable.    Assessed oral cavity; baby has a high palate and an uncoordinated suck.  Asked mother to demonstrate hand expression; teaching needed.  After teaching, mother was still unable to express any colostrum drops.  Assisted to latch after a couple attempts.  Baby took a few strong sucks and then became very agitated and unwilling to continue.  Calmed him with 2 mls of donor breast milk and latched again using a #24 NS.  Father assisted with using the 5 Fr feeding tube in the NS tip.  Baby sucked on/off for 5 minutes before becoming too agitated to continue.  Burped well and allowed him to suck on my gloved finger with cheek/jaw support.  Father assisted with the syringe and he consumed an additional 20 mls of donor breast milk.  By the end of the feeding he was more capable of sucking appropriately and even stopped the initial "biting" and "chomping" sucks that he had initially.  Significant amount of education completed with the parents.  Mother will continue to breast feed first after discharge using the NS.  Father will assist with the 5 Fr feeding tube.  Mother will consistently pump after every feeding until her milk is in full supply.  Mother has a manual pump and a DEBP for home use.  Parents feel like they can continue to work together well at home with this plan.  Baby will be circumcised today; discussed possible feeding difficulties after circumcision.  Parents verbalized understanding.  Supplementation  guidelines given.  RN updated.   Maternal Data Has patient been taught Hand Expression?: Yes  Feeding Mother's Current Feeding Choice: Breast Milk and Donor Milk Nipple Type: Slow - flow  LATCH Score Latch: Repeated attempts needed to sustain latch, nipple held in mouth throughout feeding, stimulation needed to elicit sucking reflex.  Audible Swallowing: None  Type of Nipple: Everted at rest and after stimulation (short shafted)  Comfort (Breast/Nipple): Soft / non-tender  Hold (Positioning): Assistance needed to correctly position infant at breast and maintain latch.  LATCH Score: 6   Lactation Tools Discussed/Used Tools: Pump;Nipple Shields;97F feeding tube / Syringe Nipple shield size: 20 Flange Size: 24 Breast pump type: Double-Electric Breast Pump;Manual Reason for Pumping: Stimulation for supplementation  Interventions Interventions: Breast feeding basics reviewed;Assisted with latch;Skin to skin;Breast massage;Breast compression;Adjust position;DEBP;Hand pump;Expressed milk;Position options;Support pillows;Education  Discharge Discharge Education: Engorgement and breast care Pump: DEBP;Manual;Personal  Consult Status Consult Status: Complete Date: 04/23/20 Follow-up type: Call as needed    Oshua Mcconaha R Deklan Minar 04/23/2020, 10:08 AM

## 2020-04-24 ENCOUNTER — Other Ambulatory Visit: Payer: 59 | Admitting: Obstetrics & Gynecology

## 2020-04-25 ENCOUNTER — Other Ambulatory Visit: Payer: Self-pay | Admitting: *Deleted

## 2020-04-25 DIAGNOSIS — R339 Retention of urine, unspecified: Secondary | ICD-10-CM

## 2020-05-26 ENCOUNTER — Other Ambulatory Visit: Payer: Self-pay

## 2020-05-26 ENCOUNTER — Other Ambulatory Visit: Payer: Self-pay | Admitting: Family Medicine

## 2020-05-26 ENCOUNTER — Ambulatory Visit (HOSPITAL_COMMUNITY)
Admission: RE | Admit: 2020-05-26 | Discharge: 2020-05-26 | Disposition: A | Discharge status: Home / Self Care | Payer: 59 | Source: Ambulatory Visit | Attending: Family Medicine | Admitting: Family Medicine

## 2020-05-26 ENCOUNTER — Telehealth: Payer: Self-pay | Admitting: *Deleted

## 2020-05-26 DIAGNOSIS — M7989 Other specified soft tissue disorders: Secondary | ICD-10-CM | POA: Insufficient documentation

## 2020-05-26 DIAGNOSIS — M79605 Pain in left leg: Secondary | ICD-10-CM | POA: Diagnosis present

## 2020-05-26 NOTE — Telephone Encounter (Signed)
Patient wants to discuss how the [facility] testing was billed.

## 2020-05-31 ENCOUNTER — Encounter: Payer: Self-pay | Admitting: Physical Therapy

## 2020-05-31 ENCOUNTER — Other Ambulatory Visit: Payer: Self-pay

## 2020-05-31 ENCOUNTER — Ambulatory Visit: Payer: 59 | Attending: Advanced Practice Midwife | Admitting: Physical Therapy

## 2020-05-31 DIAGNOSIS — R252 Cramp and spasm: Secondary | ICD-10-CM

## 2020-05-31 DIAGNOSIS — M6281 Muscle weakness (generalized): Secondary | ICD-10-CM

## 2020-05-31 NOTE — Patient Instructions (Signed)
Double Voiding can be a very useful technique to help overcome incomplete emptying of your bladder.  Incomplete emptying of urine can result in leakage after using the bathroom and increase the risk of urinary tract infection.   Initial Void: When you first sit down to urinate, ensure optimal positioning for bladder emptying by following these guidelines for toileting posture: Sit on the toilet seat - don't hover over the seat Support your trunk by placing your hands on your knees or thighs Spread your knees and hips wide Position your feet flat on the floor or elevate feet on phone books, foot stool (Squatty Potty), or wrapped toilet paper rolls (if having knees above hips helps you empty) Lean forward from your hips Maintain the normal inward curve in your lower back   Repeated Void: After your initial void is complete, follow these movement patterns and attempt going to the bathroom again. Stand up Rotate your hips as if doing hula hoop in one direction Rotate using the same action in the other direction Rock your hips and pelvis back and forwards ("pelvic tilts") Rock your hips and pelvis side to side ("tail wag") Sit back down and repeat your voiding technique This technique can be repeated as many times as you choose to help you empty your bladder more effectively.  

## 2020-06-01 ENCOUNTER — Ambulatory Visit (INDEPENDENT_AMBULATORY_CARE_PROVIDER_SITE_OTHER): Payer: 59 | Admitting: Advanced Practice Midwife

## 2020-06-01 ENCOUNTER — Encounter: Payer: Self-pay | Admitting: Advanced Practice Midwife

## 2020-06-01 MED ORDER — METOCLOPRAMIDE HCL 10 MG PO TABS
10.0000 mg | ORAL_TABLET | Freq: Three times a day (TID) | ORAL | 1 refills | Status: DC
Start: 2020-06-01 — End: 2020-11-21

## 2020-06-01 NOTE — Progress Notes (Signed)
Post Partum Visit Note   Chief Complaint:   Postpartum Care  History of Present Illness:   Angel Diaz is a 25 y.o. G11P1001 Caucasian female being seen today for a postpartum visit. She is 6 weeks postpartum following a spontaneous vaginal delivery at 38.5 gestational weeks. IOL: No,  . Anesthesia: epidural.  Laceration: 2nd degree.  Complications: none. Inpatient contraception: no.   Pregnancy uncomplicated. Tobacco use: no. Substance use disorder: no. Last pap smear: 11/10/17 and results were normal Next pap smear due: 10/22 Patient's last menstrual period was 05/25/2020 (exact date).  Postpartum course has been complicated by ongoing urinary retention/bladder spasms.She had her first pelvic PT visit yesterday for urinary retention/spasms (was problematic well before pregnancy) Bleeding started period last Friday, ended a few days ago. Bowel function is normal. Bladder function is abnormal: (urinary retention). Urinary incontinence? No, fecal incontinence? No Patient is not sexually active. Last sexual activity: prior to birth.  Desired contraception: Condoms. Patient does want a pregnancy in the future.  Desired family size is 3 children.   Upstream - 06/01/20 1341      Pregnancy Intention Screening   Does the patient want to become pregnant in the next year? No    Does the patient's partner want to become pregnant in the next year? No    Would the patient like to discuss contraceptive options today? No      Contraception Wrap Up   Current Method Abstinence    End Method Female Condom    Contraception Counseling Provided No          The pregnancy intention screening data noted above was reviewed. Potential methods of contraception were discussed. The patient elected to proceed with Female Condom.   Edinburgh Postpartum Depression Screening: Negative  Edinburgh Postnatal Depression Scale - 06/01/20 1341      Edinburgh Postnatal Depression Scale:  In the Past 7 Days   I  have been able to laugh and see the funny side of things. 0    I have looked forward with enjoyment to things. 0    I have blamed myself unnecessarily when things went wrong. 0    I have been anxious or worried for no good reason. 2    I have felt scared or panicky for no good reason. 2    Things have been getting on top of me. 1    I have been so unhappy that I have had difficulty sleeping. 0    I have felt sad or miserable. 0    I have been so unhappy that I have been crying. 1    The thought of harming myself has occurred to me. 0    Edinburgh Postnatal Depression Scale Total 6          Baby's course has been uncomplicated. Baby is feeding by breast: milk supply inadequate .Baby doesn't latch well, so pumps (has worked with Loss adjuster, chartered,).  Uses nipple shield, but its a lot of trouble. Know that she needs to pump more, working on it. Using supplements, offered reglan, accepted. Infant has a pediatrician/family doctor? Yes.  Childcare strategy if returning to work/school: n/a.  Pt has material needs met for her and baby: Yes.   Review of Systems:   Pertinent items are noted in HPI Denies Abnormal vaginal discharge w/ itching/odor/irritation, headaches, visual changes, shortness of breath, chest pain, abdominal pain, severe nausea/vomiting, or problems with urination or bowel movements. Pertinent History Reviewed:  Reviewed past medical,surgical, obstetrical and family  history.  Reviewed problem list, medications and allergies. OB History  Gravida Para Term Preterm AB Living  1 1 1  0 0 1  SAB IAB Ectopic Multiple Live Births  0 0 0 0 1    # Outcome Date GA Lbr Len/2nd Weight Sex Delivery Anes PTL Lv  1 Term 04/21/20 [id number] / 02:45 8 lb 1.8 oz (3.68 kg) M Vag-Spont EPI  LIV     Birth Comments: None   Physical Assessment:   Vitals:   06/01/20 1338  BP: 114/75  Pulse: 77  Weight: 188 lb (85.3 kg)  Height: 5' 5"  (1.651 m)  Body mass index is 31.28  kg/m.  Objective:  Blood pressure 114/75, pulse 77, height 5' 5"  (1.651 m), weight 188 lb (85.3 kg), last menstrual period 05/25/2020, currently breastfeeding.  General:  alert, cooperative, and no distress   Breasts:  negative  Lungs: Normal respiratory effort  Heart:  regular rate and rhythm  Abdomen: soft, non-tender,    Vulva:  normal  Vagina: normal vagina  Cervix:  normal  Corpus: Well involuted  Adnexa:  not evaluated  Rectal Exam: no hemorrhoids          No results found for this or any previous visit (from the past 24 hour(s)).  Assessment & Plan:  1) Postpartum exam 2) 6 wks s/p spontaneous vaginal delivery 3) breast & bottle feeding 4) Depression screening 5) Contraception condome  Essential components of care per ACOG recommendations:  1.  Mood and well being:  . If positive depression screen, discussed and plan developed.  . If using tobacco we discussed reduction/cessation and risk of relapse . If current substance abuse, we discussed and referral to local resources was offered.   2. Infant care and feeding:  . If breastfeeding, discussed returning to work, pumping, breastfeeding-associated pain, guidance regarding return to fertility while lactating if not using another method. If needed, patient was provided with a letter to be allowed to pump q 2-3hrs to support lactation in a private location with access to a refrigerator to store breastmilk.   . Recommended that all caregivers be immunized for flu, pertussis and other preventable communicable diseases . If pt does not have material needs met for her/baby, referred to local resources for help obtaining these.  3. Sexuality, contraception and birth spacing . Provided guidance regarding sexuality, management of dyspareunia, and resumption of intercourse . Discussed avoiding interpregnancy interval <634ms and recommended birth spacing of 18 months  4. Sleep and fatigue . Discussed coping options for fatigue  and sleep disruption . Encouraged family/partner/community support of 4 hrs of uninterrupted sleep to help with mood and fatigue  5. Physical recovery  . If pt had a C/S, assessed incisional pain and providing guidance on normal vs prolonged recovery . If pt had a laceration, perineal healing and pain reviewed.  . If urinary or fecal incontinence, discussed management and referred to PT or uro/gyn if indicated  . Patient is safe to resume physical activity. Discussed attainment of healthy weight.  6.  Chronic disease management . Discussed pregnancy complications if any, and their implications for future childbearing and long-term maternal health. . Review recommendations for prevention of recurrent pregnancy complications, such as 17 hydroxyprogesterone caproate to reduce risk for recurrent PTB not applicable, or aspirin to reduce risk of preeclampsia not applicable. . Pt had GDM: No. If yes, 2hr GTT scheduled: not applicable. . Reviewed medications and non-pregnant dosing including consideration of whether pt is breastfeeding using a reliable  resource such as LactMed: not applicable . Referred for f/u w/ PCP or subspecialist providers as indicated: not applicable  7. Health maintenance . Mammogram at 26yo or earlier if indicated . Pap smears as indicated  Meds: No orders of the defined types were placed in this encounter.   Follow-up: No follow-ups on file.   No orders of the defined types were placed in this encounter.      [facility] for [employer] 06/01/2020 1:49 PM

## 2020-06-01 NOTE — Therapy (Signed)
[facility] [address] [place], [place], [address] Phone: [phone]   Fax:  [phone]  Physical Therapy Evaluation  Patient Details  Name: Angel Diaz MRN: [id number] Date of Birth: 1995/08/22 Referring Provider (PT): Jacklyn Shell, [place]   Encounter Date: 05/31/2020   PT End of Session - 05/31/20 1625    Visit Number 1    Date for PT Re-Evaluation 08/23/20    Authorization Type [employer]    PT Start Time 1615    PT Stop Time 1700    PT Time Calculation (min) 45 min    Activity Tolerance Patient tolerated treatment well    Behavior During Therapy [facility] for tasks assessed/performed           Past Medical History:  Diagnosis Date  . UTI (urinary tract infection)     Past Surgical History:  Procedure Laterality Date  . 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    Subjective Assessment - 05/31/20 1617    Subjective Pt states she had UTIs but before that did not have this issue.  Pt can tell she is tight. She goes frequently every hour when hydrated.  Pt drinks about 3 water bottles and a cup of coffee and cannot drink more than that.  Pt states she gets a UTI every time she has intercourse.    Patient Stated Goals be able to void without straining    Currently in Pain? No/denies              [facility] PT Assessment - 06/01/20 0001      Assessment   Medical Diagnosis R33.9 (ICD-10-CM) - Urinary retention; O80 (ICD-10-CM) - Vaginal delivery    Referring Provider (PT) Cresenzo-Dishmon, Scarlette Calico, CNM    Onset Date/Surgical Date --   years - ongoing   Prior Therapy No      Precautions   Precautions None      Balance Screen   Has the patient fallen in the past 6 months No      Home Environment   Living Environment Private residence    Living Arrangements Spouse/significant other;Children   infant      Prior Function   Level of Independence Independent      Cognition   Overall Cognitive Status Within Functional Limits for tasks assessed      Posture/Postural Control   Posture/Postural Control Postural limitations    Postural Limitations Rounded Shoulders      ROM / Strength   AROM / PROM / Strength AROM;PROM;Strength      AROM   Overall AROM Comments lumbar 60%      PROM   Overall PROM Comments hip ER 75% Rt      Strength   Overall Strength Comments Lt adductor 4/5      Flexibility   Soft Tissue Assessment /Muscle Length yes    Hamstrings 75% bil      Palpation   Palpation comment DRA 1 finger above umbilicus and mild depth      Ambulation/Gait   Gait Pattern Within Functional Limits                      Objective measurements completed on examination: See above findings.     Pelvic Floor Special Questions - 06/01/20 0001    Prior Pelvic/Prostate Exam Yes  Are you Pregnant or attempting pregnancy? No    Prior Pregnancies Yes    Number of Pregnancies 1    Number of Vaginal Deliveries 1    Any difficulty with labor and deliveries No    Currently Sexually Active No    Marinoff Scale no problems    Urinary Leakage No    Urinary urgency No    Urinary frequency every hour    Fecal incontinence No   no constipation   Perineal Body/Introitus  Descended    Prolapse Anterior Wall    Pelvic Floor Internal Exam pt identity confirmed and internal sof tissue assessed with consent    Exam Type Vaginal    Palpation very inflamed/TTP Rt levator attachments; Lt tight    Strength weak squeeze, no lift    Strength # of reps 2    Strength # of seconds 1    Tone high                    PT Education - 06/01/20 1009    Education Details double void techniques    Person(s) Educated Patient    Methods Explanation;Demonstration;Verbal cues;Handout    Comprehension Verbalized understanding            PT Short Term Goals - 06/01/20 0951      PT  SHORT TERM GOAL #1   Title ind with initial HEP    Time 4    Period Weeks    Status New    Target Date 06/28/20             PT Long Term Goals - 06/01/20 0951      PT LONG TERM GOAL #1   Title Pt will report she is having at least 75% less pain with voiding    Time 12    Period Weeks    Status New    Target Date 08/23/20      PT LONG TERM GOAL #2   Title Pt will be able to void without any headaches    Time 12    Period Weeks    Status New    Target Date 08/23/20      PT LONG TERM GOAL #3   Title Pt will ind with advanced HEP in order to manage tension and muscle spasms while returning to regular exercise    Time 12    Period Weeks    Status New    Target Date 08/23/20                  Plan - 05/31/20 2016    Clinical Impression Statement Pt has chronic bladder retention and recent vaginal delivery which eased symptoms but now are back.  Anterior prolapse present rt pelvic floor very TTP and inflamed.  Pt has slight DRA from umibilicus up.  Lt adductors very tight and 4/5 MMT, transverse peroneus tight and TTP. MMT 2/5 one sec hold, 3 reps and holding breath.  Reviewed toileting techniques and diaphragmatic breathing.  Hamstring and lumbar tight with reduced lumbar flexion.  Pt will benefit from skilled PT to address impairments and restore functional activities and reduce risk of further prolapse    Personal Factors and Comorbidities Past/Current Experience;Time since onset of injury/illness/exacerbation    Examination-Activity Limitations Toileting;Continence    Examination-Participation Restrictions Community Activity;Interpersonal Relationship    Stability/Clinical Decision Making Stable/Uncomplicated    Clinical Decision Making Low    Rehab Potential Excellent    PT Frequency 1x /  week    PT Duration 12 weeks    PT Treatment/Interventions ADLs/Self Care Home Management;Biofeedback;Canalith Repostioning;Cryotherapy;Electrical Stimulation;Moist  Heat;Therapeutic activities;Therapeutic exercise;Neuromuscular re-education;Patient/family education;Manual techniques;Taping;Passive range of motion;Dry needling    PT Next Visit Plan internal STM, breathing and stretches, sitting on foam noodle, f/u on toileting techniques, ball circles, h/s stretch    Consulted and Agree with Plan of Care Patient           Patient will benefit from skilled therapeutic intervention in order to improve the following deficits and impairments:  Impaired flexibility,Increased muscle spasms,Pain,Increased fascial restricitons,Decreased strength,Decreased range of motion,Decreased coordination  Visit Diagnosis: Muscle weakness (generalized)  Cramp and spasm     Problem List Patient Active Problem List   Diagnosis Date Noted  . Indication for care in labor or delivery 04/21/2020  . Vaginal delivery 04/21/2020  . Supervision of normal first pregnancy 10/06/2019    Junious Silk, PT 06/01/2020, 10:17 AM  [facility] [address], [address] [place], [place], [address] Phone: [phone]   Fax:  [phone]  Name: Angel Diaz MRN: [id number] Date of Birth: 01/12/1996

## 2020-07-08 ENCOUNTER — Encounter (HOSPITAL_COMMUNITY): Payer: Self-pay

## 2020-07-08 ENCOUNTER — Other Ambulatory Visit: Payer: Self-pay

## 2020-07-08 ENCOUNTER — Ambulatory Visit (HOSPITAL_COMMUNITY)
Admission: EM | Admit: 2020-07-08 | Discharge: 2020-07-08 | Disposition: A | Discharge status: Home / Self Care | Payer: 59 | Attending: Emergency Medicine | Admitting: Emergency Medicine

## 2020-07-08 DIAGNOSIS — R140 Abdominal distension (gaseous): Secondary | ICD-10-CM | POA: Diagnosis not present

## 2020-07-08 DIAGNOSIS — R1032 Left lower quadrant pain: Secondary | ICD-10-CM | POA: Diagnosis not present

## 2020-07-08 MED ORDER — DICYCLOMINE HCL 20 MG PO TABS: 20.0000 mg | ORAL_TABLET | Freq: Two times a day (BID) | ORAL | 0 refills | Status: DC

## 2020-07-08 NOTE — ED Provider Notes (Signed)
[facility]    CSN: [id number] Arrival date & time: 07/08/20  1714      History   Chief Complaint Chief Complaint  Patient presents with   Abdominal Pain   Gas   Diarrhea    HPI Angel Diaz is a 25 y.o. female.   Patient here for evaluation of left lower quadrant pain, bloating, and diarrhea that has been ongoing for the past few days.  Denies any nausea or vomiting or fevers.  Denies any recent sick contacts.  Has not taken any OTC medications or treatments.  Denies any trauma, injury, or other precipitating event.  Denies any specific alleviating or aggravating factors.  Denies any fevers, chest pain, shortness of breath, numbness, tingling, weakness, or headaches.     The history is provided by the patient.  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 Authorizing Provider  acetaminophen (TYLENOL) 325 MG tablet Take 650 mg by mouth every 6 (six) hours as needed for mild pain or headache.    [provider]  coconut oil OIL Apply 1 application topically as needed. 04/23/20   Leftwich-Kirby, Wilmer Floor, CNM  cyclobenzaprine (FLEXERIL) 10 MG tablet Take 1 tablet (10 mg total) by mouth every 8 (eight) hours as needed for muscle  spasms. 04/20/20   Cresenzo-Dishmon, Scarlette Calico, CNM  ibuprofen (ADVIL) 600 MG tablet Take 1 tablet (600 mg total) by mouth every 6 (six) hours. 04/23/20   Leftwich-Kirby, Wilmer Floor, CNM  metoCLOPramide (REGLAN) 10 MG tablet Take 1 tablet (10 mg total) by mouth in the morning, at noon, and at bedtime. 06/01/20   Cresenzo-Dishmon, Scarlette Calico, CNM  Prenatal Vit-Fe Fumarate-FA (PRENATAL MULTIVITAMIN) TABS tablet Take 1 tablet by mouth daily at 12 noon.    [provider]    Family History Family History  Problem Relation Age of Onset   Heart disease Paternal Grandmother    Diabetes Father    Cancer Cousin        breast    Social History Social History   Tobacco Use   Smoking status: Never   Smokeless tobacco: Never  Vaping Use   Vaping Use: Never used  Substance Use Topics   Alcohol use: Not Currently    Comment: rarely   Drug use: Never     Allergies   Sulfa antibiotics   Review of Systems Review of Systems  Gastrointestinal:  Positive for abdominal pain and diarrhea.  All other systems reviewed  and are negative.   Physical Exam Triage Vital Signs ED Triage Vitals  Enc Vitals Group     BP 07/08/20 1737 123/77     Pulse Rate 07/08/20 1737 69     Resp 07/08/20 1737 18     Temp 07/08/20 1737 98.6 F (37 C)     Temp Source 07/08/20 1737 Oral     SpO2 07/08/20 1737 100 %     Weight --      Height --      Head Circumference --      Peak Flow --      Pain Score 07/08/20 1733 6     Pain Loc --      Pain Edu? --      Excl. in GC? --    No data found.  Updated Vital Signs BP 123/77 (BP Location: Right Arm)   Pulse 69   Temp 98.6 F (37 C) (Oral)   Resp 18   LMP 07/03/2020 (Approximate)   SpO2 100%   Breastfeeding Yes   Visual Acuity Right Eye Distance:   Left Eye Distance:   Bilateral Distance:    Right Eye Near:   Left Eye Near:    Bilateral Near:     Physical Exam Vitals and nursing note reviewed.  Constitutional:      General: She is not in acute  distress.    Appearance: Normal appearance. She is not ill-appearing, toxic-appearing or diaphoretic.  HENT:     Head: Normocephalic and atraumatic.  Eyes:     Conjunctiva/sclera: Conjunctivae normal.  Cardiovascular:     Rate and Rhythm: Normal rate.     Pulses: Normal pulses.  Pulmonary:     Effort: Pulmonary effort is normal.  Abdominal:     General: Abdomen is flat. Bowel sounds are normal.     Palpations: Abdomen is soft.     Tenderness: There is abdominal tenderness in the periumbilical area and left lower quadrant. There is no right CVA tenderness, left CVA tenderness, guarding or rebound. Negative signs include Murphy's sign, Rovsing's sign, McBurney's sign, psoas sign and obturator sign.     Hernia: No hernia is present.  Musculoskeletal:        General: Normal range of motion.     Cervical back: Normal range of motion.  Skin:    General: Skin is warm and dry.  Neurological:     General: No focal deficit present.     Mental Status: She is alert and oriented to person, place, and time.  Psychiatric:        Mood and Affect: Mood normal.     UC Treatments / Results  Labs (all labs ordered are listed, but only abnormal results are displayed) Labs Reviewed - No data to display  EKG   Radiology No results found.  Procedures Procedures (including critical care time)  Medications Ordered in UC Medications - No data to display  Initial Impression / Assessment and Plan / UC Course  I have reviewed the triage vital signs and the nursing notes.  Pertinent labs & imaging results that were available during my care of the patient were reviewed by me and considered in my medical decision making (see chart for details).    Left lower quadrant abdominal pain, bloating, and diarrhea.  Appendicitis, cholecystitis unlikely based on evaluation.  Consider diverticulitis.  Discussed treating patient for diverticulitis but she declines antibiotics at this time.  Patient states  that she is breast-feeding and does not want  to use antibiotics if they are unnecessary.  Recommend conservative management with BRAT or bland food diet, ginger/mint for nausea, and OTC medications for gas.  Encourage patient to follow-up with primary care for possible outpatient imaging if symptoms do not improve by Monday.  Otherwise recommended the patient go to the emergency room for further evaluation of abdominal pain.  Final Clinical Impressions(s) / UC Diagnoses   Final diagnoses:  Left lower quadrant abdominal pain  Bloating     Discharge Instructions      Try a BRAT (bananas, rice, applesause, toast) or bland food diet for the next few days.  Stick with foods that are gentle on your stomach.  Avoid foods that are difficult to digest, such as diary.  As you feel better, you can start eating like you do normally.    You can use ginger (ginger ale, ginger candy) and mint for nausea.    Make sure you are drinking plenty of fluids, such as water, powerade/gatorade, pedialyte, juices, and teas.    Follow up with your primary care on Monday for further evaluation of abdominal pain.  If your pain gets worse before Monday, go to the Emergency Department.      ED Prescriptions     Medication Sig Dispense Auth. Provider   dicyclomine (BENTYL) 20 MG tablet  (Status: Discontinued) Take 1 tablet (20 mg total) by mouth 2 (two) times daily. 20 tablet Ivette Loyal, NP      PDMP not reviewed this encounter.   Ivette Loyal, NP 07/08/20 [phone]

## 2020-07-08 NOTE — ED Triage Notes (Signed)
Pt in with c/o LUQ sharp pain and diarrhea for a few days  Pt states pain radiates to belly button at times and is worse at night  Denies any fever, N/v

## 2020-07-08 NOTE — Discharge Instructions (Addendum)
Try a BRAT (bananas, rice, applesause, toast) or bland food diet for the next few days.  Stick with foods that are gentle on your stomach.  Avoid foods that are difficult to digest, such as diary.  As you feel better, you can start eating like you do normally.    You can use ginger (ginger ale, ginger candy) and mint for nausea.    Make sure you are drinking plenty of fluids, such as water, powerade/gatorade, pedialyte, juices, and teas.    Follow up with your primary care on Monday for further evaluation of abdominal pain.  If your pain gets worse before Monday, go to the Emergency Department.

## 2020-07-10 ENCOUNTER — Other Ambulatory Visit (HOSPITAL_COMMUNITY): Payer: Self-pay | Admitting: Physician Assistant

## 2020-07-10 DIAGNOSIS — R197 Diarrhea, unspecified: Secondary | ICD-10-CM

## 2020-07-10 DIAGNOSIS — R1011 Right upper quadrant pain: Secondary | ICD-10-CM

## 2020-07-10 DIAGNOSIS — R140 Abdominal distension (gaseous): Secondary | ICD-10-CM

## 2020-07-11 ENCOUNTER — Ambulatory Visit: Payer: 59 | Admitting: Physical Therapy

## 2020-07-12 ENCOUNTER — Ambulatory Visit: Payer: 59 | Admitting: Advanced Practice Midwife

## 2020-07-17 ENCOUNTER — Ambulatory Visit (HOSPITAL_COMMUNITY)
Admission: RE | Admit: 2020-07-17 | Discharge: 2020-07-17 | Disposition: A | Discharge status: Home / Self Care | Payer: 59 | Source: Ambulatory Visit | Attending: Physician Assistant | Admitting: Physician Assistant

## 2020-07-17 ENCOUNTER — Other Ambulatory Visit: Payer: Self-pay

## 2020-07-17 DIAGNOSIS — R140 Abdominal distension (gaseous): Secondary | ICD-10-CM | POA: Insufficient documentation

## 2020-07-17 DIAGNOSIS — R197 Diarrhea, unspecified: Secondary | ICD-10-CM | POA: Insufficient documentation

## 2020-07-17 DIAGNOSIS — R1011 Right upper quadrant pain: Secondary | ICD-10-CM | POA: Insufficient documentation

## 2020-07-18 ENCOUNTER — Ambulatory Visit: Payer: 59 | Attending: Advanced Practice Midwife | Admitting: Physical Therapy

## 2020-07-18 DIAGNOSIS — R252 Cramp and spasm: Secondary | ICD-10-CM | POA: Insufficient documentation

## 2020-07-18 DIAGNOSIS — M6281 Muscle weakness (generalized): Secondary | ICD-10-CM | POA: Insufficient documentation

## 2020-07-18 NOTE — Therapy (Signed)
[facility] [address], [address] [place], [place], [address] Phone: [phone]   Fax:  [phone]  Physical Therapy Treatment  Patient Details  Name: Angel Diaz MRN: [id number] Date of Birth: 11-11-1995 Referring Provider (PT): Jacklyn Shell, [place]   Encounter Date: 07/18/2020   PT End of Session - 07/18/20 1202     Visit Number 2    Date for PT Re-Evaluation 08/23/20    Authorization Type [employer]    PT Start Time 1148    PT Stop Time 1228    PT Time Calculation (min) 40 min    Activity Tolerance Patient tolerated treatment well    Behavior During Therapy [facility] for tasks assessed/performed             Past Medical History:  Diagnosis Date   UTI (urinary tract infection)     Past Surgical History:  Procedure Laterality Date   SPINE SURGERY     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   Subjective Assessment - 07/18/20 1152     Subjective Pt states it is a little better and feels like the breathing and squatty potty helped be able to void and feels like it is completely emptying.                               [facility] Adult PT Treatment/Exercise - 07/18/20 0001       Exercises   Exercises Lumbar      Lumbar Exercises: Stretches   Active Hamstring Stretch Right;Left;30 seconds    Double Knee to Chest Stretch 2 reps;20 seconds    Double Knee to Chest Stretch Limitations happy baby    Figure 4 Stretch 2 reps;20 seconds    Other Lumbar Stretch Exercise butterfly    Other Lumbar Stretch Exercise child pose      Manual Therapy   Manual Therapy Internal Pelvic Floor    Manual therapy comments pt identity confirmed and informed consent given to treat    Internal Pelvic Floor bilateral levators and ischiocavernosis              Trigger Point Dry Needling - 07/18/20 0001     Consent Given? Yes     Education Handout Provided Yes    Muscles Treated Lower Quadrant Adductor longus/brevis/magnus    Adductor Response Twitch response elicited;Palpable increased muscle length                  PT Education - 07/18/20 1501     Education Details stretch vaginal canal, dry needling info, Access Code: [email or web address]    Person(s) Educated Patient    Methods Explanation;Demonstration;Tactile cues;Verbal cues;Handout    Comprehension Verbalized understanding;Returned demonstration              PT Short Term Goals - 07/18/20 1515       PT SHORT TERM GOAL #1   Title ind with initial HEP    Status Achieved               PT Long Term Goals - 06/01/20 0951       PT LONG TERM GOAL #1   Title Pt will report she is having at least 75% less pain with voiding    Time 12    Period Weeks    Status New    Target  Date 08/23/20      PT LONG TERM GOAL #2   Title Pt will be able to void without any headaches    Time 12    Period Weeks    Status New    Target Date 08/23/20      PT LONG TERM GOAL #3   Title Pt will ind with advanced HEP in order to manage tension and muscle spasms while returning to regular exercise    Time 12    Period Weeks    Status New    Target Date 08/23/20                   Plan - 07/18/20 1515     Clinical Impression Statement Pt is doing well and not had any UTIs but also has not had intercourse.  Pt responded well to dry needling the adductors on Lt side.  She got much greater muscle length after dry needling and less tender.  Pt tolerated internal STM but very TTP Lt side and tight on the Rt side.  Pt will benefit from skilled PT to continue working on soft tissue length and strength for return to functional activities.    PT Treatment/Interventions ADLs/Self Care Home Management;Biofeedback;Canalith Repostioning;Cryotherapy;Electrical Stimulation;Moist Heat;Therapeutic activities;Therapeutic exercise;Neuromuscular re-education;Patient/family  education;Manual techniques;Taping;Passive range of motion;Dry needling    PT Next Visit Plan f/u on internal STM and stretches given; do internal STM and lumbar and gluteal release, discuss return to intercourse, pelvic floor meditation    PT Home Exercise Plan Access Code: [email or web address]    Consulted and Agree with Plan of Care Patient             Patient will benefit from skilled therapeutic intervention in order to improve the following deficits and impairments:  Impaired flexibility, Increased muscle spasms, Pain, Increased fascial restricitons, Decreased strength, Decreased range of motion, Decreased coordination  Visit Diagnosis: Muscle weakness (generalized)  Cramp and spasm     Problem List Patient Active Problem List   Diagnosis Date Noted   Indication for care in labor or delivery 04/21/2020   Vaginal delivery 04/21/2020   Supervision of normal first pregnancy 10/06/2019    Brayton Caves Alexei Ey, PT 07/18/2020, 3:32 PM  [facility] [address], [address] [place], [place], [address] Phone: [phone]   Fax:  [phone]  Name: Angel Diaz MRN: [id number] Date of Birth: 1996-01-13

## 2020-07-18 NOTE — Patient Instructions (Addendum)
STRETCHING THE PELVIC FLOOR MUSCLES NO DILATOR  Supplies Vaginal lubricant Mirror (optional) Gloves (optional) or clean hands Positioning Start in a semi-reclined position with your head propped up. Bend your knees and place your thumb or finger at the vaginal opening. Procedure Apply a moderate amount of lubricant on the outer skin of your vagina, the labia minora.  Apply additional lubricant to your finger. Spread the skin away from the vaginal opening. Place the end of your finger at the opening. Do a maximum contraction of the pelvic floor muscles. Tighten the vagina and the anus maximally and relax. When you know they are relaxed, gently and slowly insert your finger into your vagina, directing your finger slightly downward, for 2-3 inches of insertion. Relax and stretch the 6 o'clock position Hold each stretch for _30-60 seconds, no pain more than 3/10 Repeat the stretching in the 4 o'clock and 8 o'clock positions. Next gently move your finger in a "U" shape  several times.  You can also enter a second finger to work to spread the vaginal opening wider from 3:00-6:00 and 6:00-9:00 or 3:00-9:00 Perform daily or every other day Once you have accomplished the techniques you may try them in standing with one foot resting on the tub, or in other positions.  This is a good stretch to do in the shower if you don't need to use lubricant.  This can be done at 35 weeks or later in your pregnancy.  Access Code: [email or web address] URL: [email or web address] Date: 07/18/2020 Prepared by: Dwana Curd  Exercises Supine Pelvic Floor Stretch - Hands on Knees - 1 x daily - 7 x weekly - 1 sets - 3 reps - 30 hold Hooklying Hamstring Stretch with Strap - 1 x daily - 7 x weekly - 1 sets - 3 reps - 30 sec hold Child's Pose Stretch - 1 x daily - 7 x weekly - 1 sets - 3 reps - 30-60 hold Supine Figure 4 Piriformis Stretch - 1 x daily - 7 x weekly - 3 reps - 1 sets - 30 sec hold Supine  Butterfly Groin Stretch - 1 x daily - 7 x weekly - 3 reps - 1 sets - 30 sec hold  Patient Education Trigger Point Dry Needling

## 2020-07-27 ENCOUNTER — Ambulatory Visit: Payer: 59 | Admitting: Physical Therapy

## 2020-07-27 ENCOUNTER — Other Ambulatory Visit: Payer: Self-pay

## 2020-07-27 ENCOUNTER — Encounter: Payer: Self-pay | Admitting: Physical Therapy

## 2020-07-27 DIAGNOSIS — R252 Cramp and spasm: Secondary | ICD-10-CM

## 2020-07-27 DIAGNOSIS — M6281 Muscle weakness (generalized): Secondary | ICD-10-CM | POA: Diagnosis not present

## 2020-07-27 NOTE — Therapy (Signed)
[facility] [address] [place], [place], [address] Phone: [phone]   Fax:  [phone]  Physical Therapy Treatment  Patient Details  Name: Angel Diaz MRN: [id number] Date of Birth: 1996-01-21 Referring Provider (PT): Jacklyn Shell, [place]   Encounter Date: 07/27/2020   PT End of Session - 07/27/20 1537     Visit Number 3    Date for PT Re-Evaluation 08/23/20    Authorization Type [employer]    PT Start Time 1533    PT Stop Time 1614    PT Time Calculation (min) 41 min             Past Medical History:  Diagnosis Date   UTI (urinary tract infection)     Past Surgical History:  Procedure Laterality Date   SPINE SURGERY     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   Subjective Assessment - 07/27/20 1535     Subjective Pt states the emptying is about the same    Patient Stated Goals be able to void without straining    Currently in Pain? No/denies                               [facility] Adult PT Treatment/Exercise - 07/27/20 0001       Neuro Re-ed    Neuro Re-ed Details  transversus abdominus  activation in supine making "s" sound bent knee fall out      Lumbar Exercises: Stretches   Other Lumbar Stretch Exercise hip rotation      Manual Therapy   Manual therapy comments pt identity confirmed and informed consent given to treat    Internal Pelvic Floor Lt levator and Rt obdurator                      PT Short Term Goals - 07/18/20 1515       PT SHORT TERM GOAL #1   Title ind with initial HEP    Status Achieved               PT Long Term Goals - 06/01/20 0951       PT LONG TERM GOAL #1   Title Pt will report she is having at least 75% less pain with voiding    Time 12    Period Weeks    Status New    Target Date 08/23/20      PT LONG TERM GOAL #2   Title Pt  will be able to void without any headaches    Time 12    Period Weeks    Status New    Target Date 08/23/20      PT LONG TERM GOAL #3   Title Pt will ind with advanced HEP in order to manage tension and muscle spasms while returning to regular exercise    Time 12    Period Weeks    Status New    Target Date 08/23/20                   Plan - 07/27/20 1724     Clinical Impression Statement Pt needed cues for transverse abdominal contracting correctly.  Pt was very tight with muscle spasms especially in Rt obdurator and Lt pubococcygeus.  pt will benefit from skilled PT to address  muslce length and coordination for improved bladder control.    PT Treatment/Interventions ADLs/Self Care Home Management;Biofeedback;Canalith Repostioning;Cryotherapy;Electrical Stimulation;Moist Heat;Therapeutic activities;Therapeutic exercise;Neuromuscular re-education;Patient/family education;Manual techniques;Taping;Passive range of motion;Dry needling    PT Next Visit Plan f/u on TrA exercise and progress, internal STM to obdurator and levators, discuss pelvic floor want    PT Home Exercise Plan Access Code: [email or web address]    Consulted and Agree with Plan of Care Patient             Patient will benefit from skilled therapeutic intervention in order to improve the following deficits and impairments:  Impaired flexibility, Increased muscle spasms, Pain, Increased fascial restricitons, Decreased strength, Decreased range of motion, Decreased coordination  Visit Diagnosis: Muscle weakness (generalized)  Cramp and spasm     Problem List Patient Active Problem List   Diagnosis Date Noted   Indication for care in labor or delivery 04/21/2020   Vaginal delivery 04/21/2020   Supervision of normal first pregnancy 10/06/2019    Junious Silk, PT 07/27/2020, 5:28 PM  [facility] [address] [place], [place], [address] Phone:  [phone]   Fax:  [phone]  Name: Angel Diaz MRN: [id number] Date of Birth: 1995/04/07

## 2020-08-03 ENCOUNTER — Encounter: Payer: Self-pay | Admitting: Physical Therapy

## 2020-08-03 ENCOUNTER — Ambulatory Visit: Payer: 59 | Attending: Advanced Practice Midwife | Admitting: Physical Therapy

## 2020-08-03 ENCOUNTER — Other Ambulatory Visit: Payer: Self-pay

## 2020-08-03 DIAGNOSIS — R252 Cramp and spasm: Secondary | ICD-10-CM | POA: Diagnosis present

## 2020-08-03 DIAGNOSIS — M6281 Muscle weakness (generalized): Secondary | ICD-10-CM | POA: Diagnosis present

## 2020-08-03 NOTE — Therapy (Signed)
[facility] [address] [place], [place], [address] Phone: [phone]   Fax:  [phone]  Physical Therapy Treatment  Patient Details  Name: Angel Diaz MRN: [id number] Date of Birth: May 02, 1995 Referring Provider (PT): Jacklyn Shell, [place]   Encounter Date: 08/03/2020   PT End of Session - 08/03/20 1532     Visit Number 4    Date for PT Re-Evaluation 08/23/20    Authorization Type [employer]    PT Start Time 1526    PT Stop Time 1614    PT Time Calculation (min) 48 min    Activity Tolerance Patient tolerated treatment well    Behavior During Therapy [facility] for tasks assessed/performed             Past Medical History:  Diagnosis Date   UTI (urinary tract infection)     Past Surgical History:  Procedure Laterality Date   SPINE SURGERY     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   Subjective Assessment - 08/03/20 1530     Subjective Pt feels like the bladder emptying is better.  Pt used the wand and it helped but is on period now.    Patient Stated Goals be able to void without straining    Currently in Pain? No/denies                               [facility] Adult PT Treatment/Exercise - 08/03/20 0001       Neuro Re-ed    Neuro Re-ed Details  TrA with overhead reach; core sets with breathing      Lumbar Exercises: Supine   Ab Set 10 reps    Other Supine Lumbar Exercises breathing and core engaged with pressure to collapse ribcage for more oblique engagement      Manual Therapy   Manual Therapy Soft tissue mobilization;Myofascial release    Soft tissue mobilization adductors bil; lumbar paraspinals    Myofascial Release lumbar fascia              Trigger Point Dry Needling - 08/03/20 0001     Consent Given? Yes    Education Handout Provided Previously provided    Muscles Treated  Back/Hip Lumbar multifidi    Adductor Response Twitch response elicited;Palpable increased muscle length    Lumbar multifidi Response Twitch response elicited;Palpable increased muscle length                    PT Short Term Goals - 07/18/20 1515       PT SHORT TERM GOAL #1   Title ind with initial HEP    Status Achieved               PT Long Term Goals - 06/01/20 0951       PT LONG TERM GOAL #1   Title Pt will report she is having at least 75% less pain with voiding    Time 12    Period Weeks    Status New    Target Date 08/23/20      PT LONG TERM GOAL #2   Title Pt will be able to void without any headaches    Time 12    Period Weeks    Status New    Target Date 08/23/20  PT LONG TERM GOAL #3   Title Pt will ind with advanced HEP in order to manage tension and muscle spasms while returning to regular exercise    Time 12    Period Weeks    Status New    Target Date 08/23/20                   Plan - 08/03/20 1632     Clinical Impression Statement Today's treatment focused on core activation.  Pt was still very tight in low back and soft tissue techniques provided in order to improve ability of the core to activate.  Pt did well with treatment and felt much more relaxed after today's treatment.  Continue with POC    PT Treatment/Interventions ADLs/Self Care Home Management;Biofeedback;Canalith Repostioning;Cryotherapy;Electrical Stimulation;Moist Heat;Therapeutic activities;Therapeutic exercise;Neuromuscular re-education;Patient/family education;Manual techniques;Taping;Passive range of motion;Dry needling    PT Next Visit Plan f/u on DN to lumbar; progress core, pelvic wand    PT Home Exercise Plan Access Code: [email or web address]    Consulted and Agree with Plan of Care Patient             Patient will benefit from skilled therapeutic intervention in order to improve the following deficits and impairments:  Impaired flexibility, Increased  muscle spasms, Pain, Increased fascial restricitons, Decreased strength, Decreased range of motion, Decreased coordination  Visit Diagnosis: Muscle weakness (generalized)  Cramp and spasm     Problem List Patient Active Problem List   Diagnosis Date Noted   Indication for care in labor or delivery 04/21/2020   Vaginal delivery 04/21/2020   Supervision of normal first pregnancy 10/06/2019    Brayton Caves Demaris Leavell, PT 08/03/2020, 4:34 PM  [facility] [address] [place], [place], [address] Phone: [phone]   Fax:  [phone]  Name: Graciela Plato MRN: [id number] Date of Birth: May 12, 1995

## 2020-08-10 ENCOUNTER — Encounter: Payer: Self-pay | Admitting: Physical Therapy

## 2020-08-10 ENCOUNTER — Ambulatory Visit: Payer: 59 | Admitting: Physical Therapy

## 2020-08-10 ENCOUNTER — Other Ambulatory Visit: Payer: Self-pay

## 2020-08-10 DIAGNOSIS — M6281 Muscle weakness (generalized): Secondary | ICD-10-CM | POA: Diagnosis not present

## 2020-08-10 DIAGNOSIS — R252 Cramp and spasm: Secondary | ICD-10-CM

## 2020-08-10 NOTE — Therapy (Signed)
[facility] [address] [place], [place], [address] Phone: [phone]   Fax:  [phone]  Physical Therapy Treatment  Patient Details  Name: Christianne Zacher MRN: [id number] Date of Birth: May 17, 1995 Referring Provider (PT): Jacklyn Shell, [place]   Encounter Date: 08/10/2020   PT End of Session - 08/10/20 1704     Visit Number 5    Date for PT Re-Evaluation 08/23/20    Authorization Type [employer]    PT Start Time 1531    PT Stop Time 1613    PT Time Calculation (min) 42 min    Activity Tolerance Patient tolerated treatment well    Behavior During Therapy [facility] for tasks assessed/performed             Past Medical History:  Diagnosis Date   UTI (urinary tract infection)     Past Surgical History:  Procedure Laterality Date   SPINE SURGERY     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   Subjective Assessment - 08/10/20 1534     Subjective I think the bladder emptying is better with the breathing and the pelvic wand made things feel more relaxed.    Patient Stated Goals be able to void without straining    Currently in Pain? No/denies                               [facility] Adult PT Treatment/Exercise - 08/10/20 0001       Self-Care   Self-Care Other Self-Care Comments    Other Self-Care Comments  review using wand      Neuro Re-ed    Neuro Re-ed Details  biofeedback with kegel in sitting with and without ball, LE kickout, standing to sitting, leaning fwd support from mat                      PT Short Term Goals - 07/18/20 1515       PT SHORT TERM GOAL #1   Title ind with initial HEP    Status Achieved               PT Long Term Goals - 08/10/20 1541       PT LONG TERM GOAL #1   Title Pt will report she is having at least 75% less pain with voiding    Baseline 50-60% better                    Plan - 08/10/20 1706     Clinical Impression Statement Pt did well with biofeedback and able to improve contraction with forward lean.  Pt unable to hold contraction for more than 2-3 sec.  Pt was given exercises to do at home.  She needed education and cue to exhale with exertion and keep back flat. She will continue to benefit from skilled PT to address posture and strength    PT Treatment/Interventions ADLs/Self Care Home Management;Biofeedback;Canalith Repostioning;Cryotherapy;Electrical Stimulation;Moist Heat;Therapeutic activities;Therapeutic exercise;Neuromuscular re-education;Patient/family education;Manual techniques;Taping;Passive range of motion;Dry needling    PT Next Visit Plan f/u on pelvic wand and kegel exercise added; biofeedback or wand review as needed, progress strength or add more manual maybe urethra mobs if needed    PT Home Exercise Plan Access Code: [email or web address]    Consulted and Agree with Plan of Care  Patient             Patient will benefit from skilled therapeutic intervention in order to improve the following deficits and impairments:  Impaired flexibility, Increased muscle spasms, Pain, Increased fascial restricitons, Decreased strength, Decreased range of motion, Decreased coordination  Visit Diagnosis: Muscle weakness (generalized)  Cramp and spasm     Problem List Patient Active Problem List   Diagnosis Date Noted   Indication for care in labor or delivery 04/21/2020   Vaginal delivery 04/21/2020   Supervision of normal first pregnancy 10/06/2019    Junious Silk, PT 08/10/2020, 5:10 PM  [facility] [address], [address] [place], [place], [address] Phone: [phone]   Fax:  [phone]  Name: Tricia Pledger MRN: [id number] Date of Birth: 1995-04-10

## 2020-08-17 ENCOUNTER — Encounter: Payer: Self-pay | Admitting: Physical Therapy

## 2020-08-17 ENCOUNTER — Ambulatory Visit: Payer: 59 | Admitting: Physical Therapy

## 2020-08-17 ENCOUNTER — Other Ambulatory Visit: Payer: Self-pay

## 2020-08-17 DIAGNOSIS — R252 Cramp and spasm: Secondary | ICD-10-CM

## 2020-08-17 DIAGNOSIS — M6281 Muscle weakness (generalized): Secondary | ICD-10-CM | POA: Diagnosis not present

## 2020-08-17 NOTE — Therapy (Signed)
[facility] [address] [place], [place], [address] Phone: [phone]   Fax:  [phone]  Physical Therapy Treatment  Patient Details  Name: Angel Diaz MRN: [id number] Date of Birth: Nov 16, 1995 Referring Provider (PT): Jacklyn Shell, [place]   Encounter Date: 08/17/2020   PT End of Session - 08/17/20 1524     Visit Number 6    Date for PT Re-Evaluation 08/23/20    Authorization Type [employer]    PT Start Time 1524    PT Stop Time 1608    PT Time Calculation (min) 44 min    Activity Tolerance Patient tolerated treatment well    Behavior During Therapy [facility] for tasks assessed/performed             Past Medical History:  Diagnosis Date   UTI (urinary tract infection)     Past Surgical History:  Procedure Laterality Date   SPINE SURGERY     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   Subjective Assessment - 08/17/20 1702     Subjective I am feeling better with the wand.  Urinating takes a few minutes to get started    Patient Stated Goals be able to void without straining    Currently in Pain? No/denies                               [facility] Adult PT Treatment/Exercise - 08/17/20 0001       Lumbar Exercises: Stretches   Piriformis Stretch 3 reps;20 seconds    Other Lumbar Stretch Exercise hip rotation      Lumbar Exercises: Sidelying   Clam Right;Left;20 reps      Manual Therapy   Soft tissue mobilization bil gluteals trigger point release and addaday                    PT Education - 08/17/20 1707     Education Details add clam, ball roll on wall and piriformis stretch    Person(s) Educated Patient    Methods Explanation;Demonstration;Tactile cues;Verbal cues;Handout    Comprehension Verbalized understanding;Returned demonstration              PT Short Term Goals -  07/18/20 1515       PT SHORT TERM GOAL #1   Title ind with initial HEP    Status Achieved               PT Long Term Goals - 08/10/20 1541       PT LONG TERM GOAL #1   Title Pt will report she is having at least 75% less pain with voiding    Baseline 50-60% better                   Plan - 08/17/20 1613     Clinical Impression Statement Today's session focused on gluteal release.  Pt is very tight throughout gluteals and piriformis and responded well to [facility] but did feel sore in gluteal after STM.  Pt was given updates to release gluteals and for HEP.  Discussed walking instead of running until she is able to build more foundational strength and prevent overactive muscles from spasming and tension so much.  Pt will benefit from skilled PT to continue to address muscle imbalances and return to full functional activities.  PT Treatment/Interventions ADLs/Self Care Home Management;Biofeedback;Canalith Repostioning;Cryotherapy;Electrical Stimulation;Moist Heat;Therapeutic activities;Therapeutic exercise;Neuromuscular re-education;Patient/family education;Manual techniques;Taping;Passive range of motion;Dry needling    PT Next Visit Plan progress quadruped and ex's she can do with baby, ex's for returning to running, gluteal STM    PT Home Exercise Plan Access Code: [email or web address]    Consulted and Agree with Plan of Care Patient             Patient will benefit from skilled therapeutic intervention in order to improve the following deficits and impairments:  Impaired flexibility, Increased muscle spasms, Pain, Increased fascial restricitons, Decreased strength, Decreased range of motion, Decreased coordination  Visit Diagnosis: Muscle weakness (generalized)  Cramp and spasm     Problem List Patient Active Problem List   Diagnosis Date Noted   Indication for care in labor or delivery 04/21/2020   Vaginal delivery 04/21/2020   Supervision of normal first pregnancy  10/06/2019    Junious Silk, PT 08/17/2020, 5:11 PM  [facility] [address] [place], [place], [address] Phone: [phone]   Fax:  [phone]  Name: Angel Diaz MRN: [id number] Date of Birth: [birth year]

## 2020-08-24 ENCOUNTER — Other Ambulatory Visit: Payer: Self-pay

## 2020-08-24 ENCOUNTER — Ambulatory Visit: Payer: 59 | Admitting: Physical Therapy

## 2020-08-24 DIAGNOSIS — M6281 Muscle weakness (generalized): Secondary | ICD-10-CM | POA: Diagnosis not present

## 2020-08-24 DIAGNOSIS — R252 Cramp and spasm: Secondary | ICD-10-CM

## 2020-08-24 NOTE — Therapy (Signed)
[facility] [address] [place], [place], [address] Phone: [phone]   Fax:  [phone]  Physical Therapy Treatment  Patient Details  Name: Angel Diaz MRN: [id number] Date of Birth: 1995/05/11 Referring Provider (PT): Jacklyn Shell, [place]   Encounter Date: 08/24/2020   PT End of Session - 08/24/20 1626     Visit Number 7    Date for PT Re-Evaluation 08/23/20    Authorization Type [employer]    PT Start Time 1531    PT Stop Time 1610    PT Time Calculation (min) 39 min    Activity Tolerance Patient tolerated treatment well    Behavior During Therapy [facility] for tasks assessed/performed             Past Medical History:  Diagnosis Date   UTI (urinary tract infection)     Past Surgical History:  Procedure Laterality Date   SPINE SURGERY     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   Subjective Assessment - 08/24/20 1631     Subjective I had intercourse and no UTI.  I feel sore in the pelvic floor ever since then and that was Saturday,    Patient Stated Goals be able to void without straining    Currently in Pain? No/denies                               [facility] Adult PT Treatment/Exercise - 08/24/20 0001       Lumbar Exercises: Supine   Other Supine Lumbar Exercises chest press with core engaged      Lumbar Exercises: Quadruped   Single Arm Raise Right;Left;5 reps      Manual Therapy   Manual therapy comments pt identity confirmed and informed consent given to treat    Internal Pelvic Floor Rt side fascial release to coccygeus which was twitching- twitching calmed down but did not stop completely                    PT Education - 08/24/20 1621     Education Details arm reaches in qped and padsicles for inflammation reduction    Person(s) Educated Patient    Methods  Explanation;Demonstration;Tactile cues;Verbal cues;Handout    Comprehension Verbalized understanding;Returned demonstration              PT Short Term Goals - 07/18/20 1515       PT SHORT TERM GOAL #1   Title ind with initial HEP    Status Achieved               PT Long Term Goals - 08/24/20 1625       PT LONG TERM GOAL #1   Title Pt will report she is having at least 75% less pain with voiding    Baseline 60% better, able to have intercourse and did not get a UTI    Status On-going      PT LONG TERM GOAL #2   Title Pt will be able to void without any headaches    Baseline just takes a few minutes    Status Achieved      PT LONG TERM GOAL #3   Title Pt will ind with advanced HEP in order to manage tension and muscle spasms while returning to regular exercise  Baseline still working on getting back to exercise without muscle spasms in pelvic floor and gluteals    Status On-going                   Plan - 08/24/20 1626     Clinical Impression Statement Today's session focused on release of pelvic floor Rt side particularly tight in coccygeus with a lot of muscle twitching.  Pt did well with adding core exercises as mentioned above.  Overall, she report much less pain after treatment today.  Pt will benefit from skilled PT to continue to address muscle tension and sof ttissue release for improved function as she builds back up to normal activities.    PT Treatment/Interventions ADLs/Self Care Home Management;Biofeedback;Canalith Repostioning;Cryotherapy;Electrical Stimulation;Moist Heat;Therapeutic activities;Therapeutic exercise;Neuromuscular re-education;Patient/family education;Manual techniques;Taping;Passive range of motion;Dry needling    PT Next Visit Plan f/u on pelvic floor STM, qped ex's and add ex's for return to running as able, STM to gluteals and internal STM as needed    PT Home Exercise Plan Access Code: [email or web address]    Consulted and Agree with Plan  of Care Patient             Patient will benefit from skilled therapeutic intervention in order to improve the following deficits and impairments:  Impaired flexibility, Increased muscle spasms, Pain, Increased fascial restricitons, Decreased strength, Decreased range of motion, Decreased coordination  Visit Diagnosis: Muscle weakness (generalized)  Cramp and spasm     Problem List Patient Active Problem List   Diagnosis Date Noted   Indication for care in labor or delivery 04/21/2020   Vaginal delivery 04/21/2020   Supervision of normal first pregnancy 10/06/2019    Junious Silk, PT 08/24/2020, 4:32 PM  [facility] [address] [place], [place], [address] Phone: [phone]   Fax:  [phone]  Name: Angel Diaz MRN: [id number] Date of Birth: Jun 19, 1995

## 2020-08-24 NOTE — Patient Instructions (Signed)
Padsicles - Postpartum Pads for Mom PADSICLES How to Make Padsicles (postpartum frozen pads) to help soothe and heal after after delivering a baby. Padsicles are the best postpartum pads because they are covered in aloe vera, witch hazel and lavender oil.  Prep Time5 minutes Active Time15 minutes Total Time20 minutes Makes20 padsicles  EQUIPMENT teaspoon  SUPPLIES 20 Extra Heavy Overnight Maxi Pads Aloe Vera Witch Hazel Lavender Essential Oil  INSTRUCTIONS Partially unwrap a few pads at a time, but don't detach the wrapper. Spread aloe vera generously up and down the whole pad. Don't just do the middle part - spread it further down almost to the bottom of the pad. Pour about a teaspoon of witch hazel down the middle.  Add a few drops of lavender oil, for healing purposes.  Fold the pads back up to how they were and stick them in a gallon sized plastic bag, then freeze. Pull them out of the freezer, one by one, as needed and let them thaw for two or three minutes before use.   [facility] [address], [address] [place], [place] [address] Phone # [phone] Fax [phone]  

## 2020-08-31 ENCOUNTER — Other Ambulatory Visit: Payer: Self-pay

## 2020-08-31 ENCOUNTER — Encounter: Payer: Self-pay | Admitting: Physical Therapy

## 2020-08-31 ENCOUNTER — Ambulatory Visit: Payer: 59 | Attending: Advanced Practice Midwife | Admitting: Physical Therapy

## 2020-08-31 DIAGNOSIS — R252 Cramp and spasm: Secondary | ICD-10-CM | POA: Insufficient documentation

## 2020-08-31 DIAGNOSIS — M6281 Muscle weakness (generalized): Secondary | ICD-10-CM | POA: Insufficient documentation

## 2020-08-31 NOTE — Therapy (Signed)
[facility] [address], [place], [place], [address] Phone: [phone]   Fax:  [phone]  Physical Therapy Treatment  Patient Details  Name: Angel Diaz MRN: [id number] Date of Birth: 03-06-1995 Referring Provider (PT): Christin Fudge, [place]   Encounter Date: 08/31/2020   PT End of Session - 08/31/20 1539     Visit Number 8    Date for PT Re-Evaluation 08/23/20    Authorization Type [employer]    PT Start Time 1533    PT Stop Time 1613    PT Time Calculation (min) 40 min    Activity Tolerance Patient tolerated treatment well    Behavior During Therapy [facility] for tasks assessed/performed             Past Medical History:  Diagnosis Date   UTI (urinary tract infection)     Past Surgical History:  Procedure Laterality Date   SPINE SURGERY     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   Subjective Assessment - 08/31/20 1537     Subjective I have been doing a lot of moving furniture and exercising.  I am getting sharp pain in my Left hip.  I get random shooting pain towards my urethra.    Patient Stated Goals be able to void without straining    Currently in Pain? No/denies                               [facility] Adult PT Treatment/Exercise - 08/31/20 0001       Manual Therapy   Manual therapy comments pt identity confirmed and informed consent given to treat    Soft tissue mobilization bil gluteals trigger point release and addaday    Internal Pelvic Floor Rt side fascial release to coccygeus which was twitching- twitching calmed down but did not stop completely                      PT Short Term Goals - 07/18/20 1515       PT SHORT TERM GOAL #1   Title ind with initial HEP    Status Achieved               PT Long Term Goals - 08/31/20 1542       PT LONG TERM GOAL #1   Title  Pt will report she is having at least 75% less pain with voiding    Baseline 60% better, able to have intercourse and did not get a UTI    Status Partially Met      PT LONG TERM GOAL #2   Title Pt will be able to void without any headaches    Status Achieved      PT LONG TERM GOAL #3   Title Pt will ind with advanced HEP in order to manage tension and muscle spasms while returning to regular exercise    Status Achieved                   Plan - 08/31/20 1739     Clinical Impression Statement Pt has met most goals and is ind with HEP for strength and pain management.  Pt will d/c from skilled PT today.    PT Treatment/Interventions ADLs/Self Care Home Management;Biofeedback;Canalith Repostioning;Cryotherapy;Electrical Stimulation;Moist Heat;Therapeutic activities;Therapeutic exercise;Neuromuscular re-education;Patient/family education;Manual techniques;Taping;Passive  range of motion;Dry needling    PT Next Visit Plan d/ctoday    PT Home Exercise Plan Access Code: [email or web address]    Consulted and Agree with Plan of Care Patient             Patient will benefit from skilled therapeutic intervention in order to improve the following deficits and impairments:  Impaired flexibility, Increased muscle spasms, Pain, Increased fascial restricitons, Decreased strength, Decreased range of motion, Decreased coordination  Visit Diagnosis: Muscle weakness (generalized)  Cramp and spasm     Problem List Patient Active Problem List   Diagnosis Date Noted   Indication for care in labor or delivery 04/21/2020   Vaginal delivery 04/21/2020   Supervision of normal first pregnancy 10/06/2019    Jule Ser, PT 08/31/2020, 5:40 PM  [facility] [address], [place], [place], [address] Phone: [phone]   Fax:  [phone]  Name: Angel Diaz MRN: [id number] Date of Birth: 24-Dec-1995  PHYSICAL THERAPY  DISCHARGE SUMMARY  Visits from Start of Care: 8  Current functional level related to goals / functional outcomes: See above goals   Remaining deficits: See above goals   Education / Equipment: HEP  Patient agrees to discharge. Patient goals were partially met. Patient is being discharged due to being pleased with the current functional level.  Gustavus Bryant, PT 08/31/20 5:40 PM

## 2020-09-20 ENCOUNTER — Other Ambulatory Visit: Payer: Self-pay | Admitting: *Deleted

## 2020-09-20 DIAGNOSIS — R339 Retention of urine, unspecified: Secondary | ICD-10-CM

## 2020-11-02 ENCOUNTER — Other Ambulatory Visit: Payer: 59 | Admitting: Adult Health

## 2020-11-03 ENCOUNTER — Other Ambulatory Visit: Payer: 59 | Admitting: Adult Health

## 2020-11-20 NOTE — Progress Notes (Signed)
Urogynecology New Patient Evaluation and Consultation  Referring Provider: Lazaro Arms, MD PCP: Richardean Chimera, MD Date of Service: 11/21/2020  SUBJECTIVE Chief Complaint: New Patient (Initial Visit) Angel Diaz is a 25 y.o. female complains of urinary retention.)  History of Present Illness: Angel Diaz is a 25 y.o. White or Caucasian female seen in consultation at the request of Dr. Despina Hidden for evaluation of urinary retention.    Review of records from Epic significant for: Has a history of urinary retention and bladder spasms. Attended pelvic floor PT. Urinates about every hour, unable to void without straining.   Urinary Symptoms: Does not leak urine.   When she was younger, had urinary leakage, so clenched her bladder a lot.  Symptoms of incomplete emptying have improved with PT.  Day time voids 4-5.  Nocturia: 0 times per night to void. Voiding dysfunction: she empties her bladder well. Has to breathe to urinate (learned in pelvic PT), previously pushed.  does not use a catheter to empty bladder.  When urinating, she feels a weak stream, difficulty starting urine stream, and to push on her belly or vagina to empty bladder Drinks: decaf coffee (occasionally non decaf), water   UTIs: Previously took antibiotic after intercourse. Tried to come off a few times.  Taking uqora (cranberry, D-mannose, green tea, turmeric) and garlic.  Will still get UTIs without condoms.  Denies history of blood in urine  Pelvic Organ Prolapse Symptoms:                  She Denies a feeling of a bulge the vaginal area.  Bowel Symptom: Bowel movements: 1-2 time(s) per day Stool consistency: hard Straining: no.  Splinting: no.  Incomplete evacuation: no.  She Denies accidental bowel leakage / fecal incontinence Bowel regimen: diet and fiber   Sexual Function Sexually active: yes.  Sexual orientation: Straight Pain with sex: Yes, deep in the  pelvis  Pelvic Pain Admits to pelvic pain Location: outer muscles, sometimes inside spasms Pain occurs: walking a lot, sex Prior pain treatment: physical therapy Improved by: PT, stretching, lubrication, massage  Used a pelvic wand previously.   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 Contraception: condoms.   Medications: She has a current medication list which includes the following prescription(s): cyclobenzaprine, prenatal multivitamin, coconut oil, and cyclobenzaprine.   Allergies: Patient is allergic to sulfa antibiotics.   Social History:  Social History   Tobacco Use   Smoking status: Never   Smokeless tobacco: Never  Vaping Use   Vaping Use: Never used  Substance Use Topics   Alcohol use: Not Currently    Comment: rarely  Drug use: Never    Relationship status: married She lives with husband.   She is not employed. Regular exercise: Yes:   History of abuse: No  Family History:   Family History  Problem Relation Age of Onset   Heart disease Paternal Grandmother    Diabetes Father    Cancer Cousin        breast     Review of Systems: Review of Systems  Constitutional:  Negative for fever, malaise/fatigue and weight loss.  Respiratory:  Negative for cough, shortness of breath and wheezing.    Cardiovascular:  Negative for chest pain, palpitations and leg swelling.  Gastrointestinal:  Negative for abdominal pain and blood in stool.  Genitourinary:  Negative for dysuria.       Abnormal periods  Musculoskeletal:  Negative for myalgias.  Skin:  Negative for rash.  Neurological:  Negative for dizziness and headaches.  Endo/Heme/Allergies:  Does not bruise/bleed easily.  Psychiatric/Behavioral:  Negative for depression. The patient is not nervous/anxious.     OBJECTIVE Physical Exam: Vitals:   11/21/20 0904  BP: 116/78  Pulse: 75  Weight: 179 lb (81.2 kg)  Height: 5\' 5"  (1.651 m)    Physical Exam Constitutional:      General: She is not in acute distress. Pulmonary:     Effort: Pulmonary effort is normal.  Abdominal:     General: There is no distension.     Palpations: Abdomen is soft.     Tenderness: There is no abdominal tenderness. There is no rebound.  Musculoskeletal:        General: No swelling. Normal range of motion.  Skin:    General: Skin is warm and dry.     Findings: No rash.  Neurological:     Mental Status: She is alert and oriented to person, place, and time.  Psychiatric:        Mood and Affect: Mood normal.        Behavior: Behavior normal.     GU / Detailed Urogynecologic Evaluation:  Pelvic Exam: Normal external female genitalia; Bartholin's and Skene's glands normal in appearance; urethral meatus normal in appearance, no urethral masses or discharge.   CST: negative  Speculum exam reveals normal vaginal mucosa without atrophy. Cervix normal appearance. Uterus normal single, nontender. Adnexa no mass, fullness, tenderness.    Pelvic floor strength II/V- does not fully relax pelvic floor with strain/ valsalva.   Pelvic floor musculature: Right levator tender, Right obturator tender, Left levator non-tender, Left obturator tender  POP-Q:   POP-Q  -2                                            Aa   -2                                            Ba  -6                                              C   4.5  Gh  4.4                                            Pb  8                                            tvl   -2                                            Ap  -2                                            Bp  -8                                              D     Rectal Exam:  Normal external rectum  Post-Void Residual (PVR) by Bladder Scan: In order to evaluate bladder emptying, we discussed obtaining a postvoid residual and she agreed to this procedure.  Procedure: The ultrasound unit was placed on the patient's abdomen in the suprapubic region after the patient had voided. A PVR of 61 ml was obtained by bladder scan.  Laboratory Results: POC urine: neg   ASSESSMENT AND PLAN Angel Diaz is a 25 y.o. with:  1. Bladder sphincter dyssynergia   2. Urinary frequency   3. Levator spasm   4. 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 She has learned through PT breathing exercises which has helped her void better.  - no significant prolapse noted on exam  2. Levator spasm - mild spasm today, suspect that it increases at times when her symptoms exacerbate - Flexeril 5mg  prescribed. She should start at night to see if it causes somnolence. If not, can take it twice per day. She should work on the pelvic wand after taking the flexeril.   3. History of UTI - recommended getting cultures if she has persistent symptoms - continue with supplements ) for prevention  Return 1 month    Darrold Junker, MD   Medical Decision Making:  - Reviewed/ ordered a clinical laboratory test - Review and summation of prior records

## 2020-11-21 ENCOUNTER — Encounter: Payer: Self-pay | Admitting: Obstetrics and Gynecology

## 2020-11-21 ENCOUNTER — Ambulatory Visit (INDEPENDENT_AMBULATORY_CARE_PROVIDER_SITE_OTHER): Payer: 59 | Admitting: Obstetrics and Gynecology

## 2020-11-21 ENCOUNTER — Other Ambulatory Visit: Payer: Self-pay

## 2020-11-21 VITALS — BP 116/78 | HR 75 | Ht 65.0 in | Wt 179.0 lb

## 2020-11-21 DIAGNOSIS — N3644 Muscular disorders of urethra: Secondary | ICD-10-CM

## 2020-11-21 DIAGNOSIS — R350 Frequency of micturition: Secondary | ICD-10-CM | POA: Diagnosis not present

## 2020-11-21 DIAGNOSIS — M62838 Other muscle spasm: Secondary | ICD-10-CM | POA: Diagnosis not present

## 2020-11-21 DIAGNOSIS — Z87440 Personal history of urinary (tract) infections: Secondary | ICD-10-CM

## 2020-11-21 LAB — POCT URINALYSIS DIPSTICK
Appearance: NORMAL
Bilirubin, UA: NEGATIVE
Blood, UA: NEGATIVE
Glucose, UA: NEGATIVE
Ketones, UA: NEGATIVE
Leukocytes, UA: NEGATIVE
Nitrite, UA: NEGATIVE
Protein, UA: NEGATIVE
Spec Grav, UA: 1.02 (ref 1.010–1.025)
Urobilinogen, UA: 0.2 E.U./dL
pH, UA: 7 (ref 5.0–8.0)

## 2020-11-21 MED ORDER — CYCLOBENZAPRINE HCL 5 MG PO TABS
5.0000 mg | ORAL_TABLET | Freq: Three times a day (TID) | ORAL | 1 refills | Status: DC | PRN
Start: 2020-11-21 — End: 2021-01-15

## 2020-11-21 NOTE — Patient Instructions (Signed)
Start flexeril at night and use prior to using the pelvic wand.

## 2020-12-20 ENCOUNTER — Other Ambulatory Visit: Payer: Self-pay | Admitting: Family Medicine

## 2020-12-20 DIAGNOSIS — N644 Mastodynia: Secondary | ICD-10-CM

## 2020-12-25 ENCOUNTER — Encounter: Payer: Self-pay | Admitting: Advanced Practice Midwife

## 2020-12-26 ENCOUNTER — Other Ambulatory Visit: Payer: 59 | Admitting: Adult Health

## 2020-12-26 ENCOUNTER — Ambulatory Visit: Payer: 59 | Admitting: Obstetrics and Gynecology

## 2021-01-15 ENCOUNTER — Encounter: Payer: Self-pay | Admitting: Adult Health

## 2021-01-15 ENCOUNTER — Other Ambulatory Visit: Payer: Self-pay

## 2021-01-15 ENCOUNTER — Ambulatory Visit: Payer: 59 | Admitting: Adult Health

## 2021-01-15 VITALS — BP 126/82 | HR 81 | Ht 65.0 in | Wt 182.0 lb

## 2021-01-15 DIAGNOSIS — R102 Pelvic and perineal pain: Secondary | ICD-10-CM | POA: Diagnosis not present

## 2021-01-15 DIAGNOSIS — Z3202 Encounter for pregnancy test, result negative: Secondary | ICD-10-CM | POA: Diagnosis not present

## 2021-01-15 DIAGNOSIS — N939 Abnormal uterine and vaginal bleeding, unspecified: Secondary | ICD-10-CM | POA: Insufficient documentation

## 2021-01-15 LAB — POCT URINE PREGNANCY: Preg Test, Ur: NEGATIVE

## 2021-01-15 NOTE — Progress Notes (Signed)
°  Subjective:     Patient ID: Angel Diaz, female   DOB: November 04, 1995, 25 y.o.   MRN: [id number]  HPI Angel Diaz is a 25 year old white female,married, G1P1001, in complaining of bleeding. She had vaginal delivery 04/21/20 and was pumping but stopped in September. No period in October and then started 12/02/20 and has bleed daily since. Has pelvic pain too, more LLQ. She had pap and physical 11/29/20 at PCP . PCP is Dr Reuel Boom.   Review of Systems +bleeding since 12/02/20  +pelvic pain Reviewed past medical,surgical, social and family history. Reviewed medications and allergies.     Objective:   Physical Exam BP 126/82 (BP Location: Left Arm, Patient Position: Sitting, Cuff Size: Normal)    Pulse 81    Ht 5\' 5"  (1.651 m)    Wt 182 lb (82.6 kg)    LMP 12/02/2020    Breastfeeding No    BMI 30.29 kg/m  UPT is negative. Skin warm and dry.Pelvic: external genitalia is normal in appearance no lesions, vagina: +period blood,urethra has no lesions or masses noted, cervix:smooth and bulbous, uterus: normal size, shape and contour, non tender, no masses felt, adnexa: no masses or tenderness noted. Bladder is non tender and no masses felt. Fall risk is low  Upstream - 01/15/21 1456       Pregnancy Intention Screening   Does the patient want to become pregnant in the next year? Yes    Does the patient's partner want to become pregnant in the next year? Yes    Would the patient like to discuss contraceptive options today? No      Contraception Wrap Up   Current Method Abstinence    End Method Female Condom            Examination chaperoned by 01/17/21 LPN     Assessment:     1. Pregnancy examination or test, negative result - POCT urine pregnancy  2. Abnormal uterine bleeding (AUB) Malachy Mood scheduled for [facility]/19/[facility] at [facility] at 1:30 pm  - [facility] PELVIC COMPLETE WITH TRANSVAGINAL; Future  3. Pelvic pain Will get pelvic US 01/19/21 at [facility] - 01/21/21 PELVIC COMPLETE WITH TRANSVAGINAL; Future      Plan:     Will talk when [place] results back. If [place] normal, may try megace to stop bleeding

## 2021-01-18 ENCOUNTER — Other Ambulatory Visit: Payer: Self-pay | Admitting: Adult Health

## 2021-01-18 MED ORDER — DICYCLOMINE HCL 10 MG PO CAPS: 10.0000 mg | ORAL_CAPSULE | Freq: Three times a day (TID) | ORAL | 1 refills | Status: DC

## 2021-01-18 MED ORDER — MEGESTROL ACETATE 40 MG PO TABS: ORAL_TABLET | ORAL | 0 refills | Status: DC

## 2021-01-18 NOTE — Progress Notes (Signed)
Rx megace??  

## 2021-01-18 NOTE — Progress Notes (Signed)
Rx bentyl 

## 2021-01-19 ENCOUNTER — Ambulatory Visit (HOSPITAL_COMMUNITY): Payer: 59

## 2021-01-24 ENCOUNTER — Ambulatory Visit
Admission: RE | Admit: 2021-01-24 | Discharge: 2021-01-24 | Disposition: A | Discharge status: Home / Self Care | Payer: 59 | Source: Ambulatory Visit | Attending: Family Medicine | Admitting: Family Medicine

## 2021-01-24 DIAGNOSIS — N644 Mastodynia: Secondary | ICD-10-CM

## 2021-01-27 ENCOUNTER — Ambulatory Visit
Admission: EM | Admit: 2021-01-27 | Discharge: 2021-01-27 | Disposition: A | Discharge status: Home / Self Care | Payer: 59 | Attending: Family Medicine | Admitting: Family Medicine

## 2021-01-27 ENCOUNTER — Other Ambulatory Visit: Payer: Self-pay

## 2021-01-27 ENCOUNTER — Encounter: Payer: Self-pay | Admitting: Emergency Medicine

## 2021-01-27 DIAGNOSIS — J0100 Acute maxillary sinusitis, unspecified: Secondary | ICD-10-CM

## 2021-01-27 MED ORDER — AMOXICILLIN-POT CLAVULANATE 875-125 MG PO TABS: 1.0000 | ORAL_TABLET | Freq: Two times a day (BID) | ORAL | 0 refills | Status: DC

## 2021-01-27 NOTE — ED Provider Notes (Signed)
[facility]    CSN: [id number] Arrival date & time: 01/27/21  1042      History   Chief Complaint Chief Complaint  Patient presents with   sinus pressure   Fatigue   Sore Throat    HPI Angel Diaz is a 25 y.o. female.   Patient presenting today with 10-day history of what started as typical cold symptoms with congestion, cough, sore throat, started to get better but then the last 3 days got significantly worse now with sinus pain and pressure, thick nasal congestion, bilateral facial swelling, eye pressure and watering, fatigue, worsening cough.  Denies recent fever, chills, body aches, chest pain, shortness of breath, abdominal pain, nausea vomiting or diarrhea.  Trying numerous over-the-counter cold and congestion medications, nasal sprays with minimal relief.  Home COVID test have been negative.  Multiple sick contacts recently.  No known pertinent chronic medical problems.   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 Authorizing Provider  amoxicillin-clavulanate (AUGMENTIN) 875-125 MG tablet Take 1 tablet by mouth every 12 (twelve) hours. 01/27/21  Yes Volney American, PA-C  megestrol (MEGACE)  40 MG tablet Take 3 x 5 days then 2 x 5 days then 1 daily till bleeding stops 01/18/21  Yes Derrek Monaco A, NP  Multiple Vitamin (MULTIVITAMIN) tablet Take 1 tablet by mouth daily.   Yes [provider]  dicyclomine (BENTYL) 10 MG capsule Take 1 capsule (10 mg total) by mouth 3 (three) times daily before meals. 01/18/21   Estill Dooms, NP  EVENING PRIMROSE OIL PO Take by mouth.    [provider]  UNABLE TO FIND Violet Daily    [provider]    Family History Family History  Problem Relation Age of Onset   Heart disease Paternal Grandmother    Diabetes Father    Cancer Cousin        breast    Social History Social History   Tobacco Use   Smoking status: Never   Smokeless tobacco: Never  Vaping Use   Vaping Use: Never used  Substance Use Topics   Alcohol use: Not Currently    Comment: rarely   Drug use: Never     Allergies   Sulfa antibiotics   Review of Systems Review of Systems Per HPI  Physical Exam Triage Vital Signs ED Triage Vitals  Enc Vitals Group     BP 01/27/21 1323 128/80  Pulse Rate 01/27/21 1323 97     Resp 01/27/21 1323 18     Temp 01/27/21 1323 98.5 F (36.9 C)     Temp Source 01/27/21 1323 Oral     SpO2 01/27/21 1323 97 %     Weight --      Height --      Head Circumference --      Peak Flow --      Pain Score 01/27/21 1322 0     Pain Loc --      Pain Edu? --      Excl. in [place]? --    No data found.  Updated Vital Signs BP 128/80 (BP Location: Right Arm)    Pulse 97    Temp 98.5 F (36.9 C) (Oral)    Resp 18    LMP  (LMP Unknown)    SpO2 97%   Visual Acuity Right Eye Distance:   Left Eye Distance:   Bilateral Distance:    Right Eye Near:   Left Eye Near:    Bilateral Near:     Physical Exam Vitals and nursing note reviewed.  Constitutional:      Appearance: Normal appearance. She is not ill-appearing.  HENT:     Head: Atraumatic.     Right Ear: Tympanic membrane and external ear  normal.     Left Ear: Tympanic membrane and external ear normal.     Nose: Congestion present.     Mouth/Throat:     Mouth: Mucous membranes are moist.     Pharynx: Posterior oropharyngeal erythema present.  Eyes:     Extraocular Movements: Extraocular movements intact.     Conjunctiva/sclera: Conjunctivae normal.  Cardiovascular:     Rate and Rhythm: Normal rate and regular rhythm.     Heart sounds: Normal heart sounds.  Pulmonary:     Effort: Pulmonary effort is normal.     Breath sounds: Normal breath sounds. No wheezing or rales.  Musculoskeletal:        General: Normal range of motion.     Cervical back: Normal range of motion and neck supple.  Skin:    General: Skin is warm and dry.  Neurological:     Mental Status: She is alert and oriented to person, place, and time.  Psychiatric:        Mood and Affect: Mood normal.        Thought Content: Thought content normal.        Judgment: Judgment normal.     UC Treatments / Results  Labs (all labs ordered are listed, but only abnormal results are displayed) Labs Reviewed - No data to display  EKG   Radiology No results found.  Procedures Procedures (including critical care time)  Medications Ordered in UC Medications - No data to display  Initial Impression / Assessment and Plan / UC Course  I have reviewed the triage vital signs and the nursing notes.  Pertinent labs & imaging results that were available during my care of the patient were reviewed by me and considered in my medical decision making (see chart for details).     Given duration and worsening course, will treat with Augmentin, Flonase, other supportive medications and home care.  Return for acutely worsening symptoms  Final Clinical Impressions(s) / UC Diagnoses   Final diagnoses:  Acute maxillary sinusitis, recurrence not specified   Discharge Instructions   None    ED Prescriptions     Medication Sig Dispense Auth. Provider  amoxicillin-clavulanate (AUGMENTIN) 875-125 MG tablet Take 1 tablet by mouth every 12 (twelve) hours. 14 tablet Particia Nearing, [place]      PDMP not reviewed this encounter.   Particia Nearing, [place] 01/27/21 [phone]

## 2021-01-27 NOTE — ED Triage Notes (Signed)
Pt c/o some cough, sinus pressure, fatigue, watery eyes, runny nose x 10 days. At home covid test was negative.

## 2021-02-23 ENCOUNTER — Ambulatory Visit (HOSPITAL_COMMUNITY): Payer: No Typology Code available for payment source

## 2021-10-04 ENCOUNTER — Other Ambulatory Visit: Payer: Self-pay

## 2021-10-04 ENCOUNTER — Ambulatory Visit
Admission: RE | Admit: 2021-10-04 | Discharge: 2021-10-04 | Disposition: A | Discharge status: Home / Self Care | Payer: No Typology Code available for payment source | Source: Ambulatory Visit | Attending: Nurse Practitioner | Admitting: Nurse Practitioner

## 2021-10-04 VITALS — BP 115/79 | HR 85 | Temp 98.3°F | Resp 20

## 2021-10-04 DIAGNOSIS — B9689 Other specified bacterial agents as the cause of diseases classified elsewhere: Secondary | ICD-10-CM

## 2021-10-04 DIAGNOSIS — Z23 Encounter for immunization: Secondary | ICD-10-CM | POA: Diagnosis not present

## 2021-10-04 DIAGNOSIS — L089 Local infection of the skin and subcutaneous tissue, unspecified: Secondary | ICD-10-CM | POA: Diagnosis not present

## 2021-10-04 MED ORDER — CEPHALEXIN 500 MG PO CAPS
500.0000 mg | ORAL_CAPSULE | Freq: Three times a day (TID) | ORAL | 0 refills | Status: AC
Start: 2021-10-04 — End: 2021-10-11

## 2021-10-04 MED ORDER — RABIES VACCINE, PCEC IM SUSR
1.0000 mL | Freq: Once | INTRAMUSCULAR | Status: AC
  Administered 2021-10-04: 1 mL via INTRAMUSCULAR

## 2021-10-04 NOTE — ED Provider Notes (Signed)
[facility]    CSN: [id number] Arrival date & time: 10/04/21  0847      History   Chief Complaint Chief Complaint  Patient presents with   Puncture Wound    I spoke with a nurse in the back today, I just need a rabies booster vaccine (my previous vaccines were from the same brand this urgent care has). - Entered by patient    HPI Angel Diaz is a 26 y.o. female.   The history is provided by the patient.   Patient presents for possible insect bites versus bat bites on the right inner thigh.  Patient states that she currently moved into a new house and knows that there is a bat in the attic.  She also states that she has worked in the yard and saw a bat flying around 3 days ago and since noticed the bites to the left thigh.  The patient states she has been using an antibiotic ointment to the areas, but they have been oozing and draining over the past 24 hours.  She states the areas have also increased in size.  Patient states that she did receive a full series of rabies vaccines in 2020 when she was working in the [employer].  She states that she reached out to the health department to see if she needed additional boosters after receiving the full series.  Patient denies fever, chills, chest pain, abdominal pain, nausea, vomiting, or diarrhea.  Patient states that she is actively trying to get pregnant.  Past Medical History:  Diagnosis Date   UTI (urinary tract infection)     Patient Active Problem List   Diagnosis Date Noted   Abnormal uterine bleeding (AUB) 01/15/2021   Pregnancy examination or test, negative result 01/15/2021   Pelvic pain 01/15/2021   Indication for care in labor or delivery 04/21/2020   Vaginal delivery 04/21/2020   Supervision of normal first pregnancy 10/06/2019    Past Surgical History:  Procedure Laterality Date   SPINE SURGERY     had growth at end of spine had removed after birth    wisdom tooth removal     2023    OB  History     Gravida  1   Para  1   Term  1   Preterm  0   AB  0   Living  1      SAB  0   IAB  0   Ectopic  0   Multiple  0   Live Births  1            Home Medications    Prior to Admission medications   Medication Sig Start Date End Date Taking? Authorizing Provider  cephALEXin (KEFLEX) 500 MG capsule Take 1 capsule (500 mg total) by mouth 3 (three) times daily for 7 days. 10/04/21 10/11/21 Yes Ziya Coonrod-Warren, Sadie Haber, NP  amoxicillin-clavulanate (AUGMENTIN) 875-125 MG tablet Take 1 tablet by mouth every 12 (twelve) hours. 01/27/21   Particia Nearing, PA-C  dicyclomine (BENTYL) 10 MG capsule Take 1 capsule (10 mg total) by mouth 3 (three) times daily before meals. 01/18/21   Adline Potter, NP  EVENING PRIMROSE OIL PO Take by mouth.    [provider]  megestrol (MEGACE) 40 MG tablet Take 3 x 5 days then 2 x 5 days then 1 daily till bleeding stops 01/18/21   Cyril Mourning A, NP  Multiple Vitamin (MULTIVITAMIN) tablet Take 1 tablet by mouth  daily.    [provider]  UNABLE TO FIND Violet Daily    [provider]    Family History Family History  Problem Relation Age of Onset   Heart disease Paternal Grandmother    Diabetes Father    Cancer Cousin        breast    Social History Social History   Tobacco Use   Smoking status: Never   Smokeless tobacco: Never  Vaping Use   Vaping Use: Never used  Substance Use Topics   Alcohol use: Not Currently    Comment: rarely   Drug use: Never     Allergies   Sulfa antibiotics   Review of Systems Review of Systems Per HPI  Physical Exam Triage Vital Signs ED Triage Vitals [10/04/21 0931]  Enc Vitals Group     BP 115/79     Pulse Rate 85     Resp 20     Temp 98.3 F (36.8 C)     Temp Source Oral     SpO2 98 %     Weight      Height      Head Circumference      Peak Flow      Pain Score 0     Pain Loc      Pain Edu?      Excl. in [place]?    No data  found.  Updated Vital Signs BP 115/79 (BP Location: Right Arm)   Pulse 85   Temp 98.3 F (36.8 C) (Oral)   Resp 20   LMP 09/10/2021 (Exact Date)   SpO2 98%   Breastfeeding No   Visual Acuity Right Eye Distance:   Left Eye Distance:   Bilateral Distance:    Right Eye Near:   Left Eye Near:    Bilateral Near:     Physical Exam Vitals and nursing note reviewed.  Constitutional:      General: She is not in acute distress.    Appearance: Normal appearance.  Eyes:     Extraocular Movements: Extraocular movements intact.     Pupils: Pupils are equal, round, and reactive to light.  Cardiovascular:     Rate and Rhythm: Normal rate and regular rhythm.     Pulses: Normal pulses.     Heart sounds: Normal heart sounds.  Pulmonary:     Effort: Pulmonary effort is normal.     Breath sounds: Normal breath sounds.  Abdominal:     General: Bowel sounds are normal.     Palpations: Abdomen is soft.  Skin:    General: Skin is warm and dry.     Comments: 2 annular areas noted to the inner upper left thigh.  Each area has 2 central punctum's noted suspicious for a bite.  There is blistering, oozing, and drainage present.  Areas are raised and surrounded by an erythematous base.  Neurological:     General: No focal deficit present.     Mental Status: She is alert and oriented to person, place, and time.  Psychiatric:        Mood and Affect: Mood normal.        Behavior: Behavior normal.      UC Treatments / Results  Labs (all labs ordered are listed, but only abnormal results are displayed) Labs Reviewed - No data to display  EKG   Radiology No results found.  Procedures Procedures (including critical care time)  Medications Ordered in UC Medications  rabies vaccine (RABAVERT)  injection 1 mL (1 mL Intramuscular Given 10/04/21 1008)    Initial Impression / Assessment and Plan / UC Course  I have reviewed the triage vital signs and the nursing notes.  Pertinent labs &  imaging results that were available during my care of the patient were reviewed by me and considered in my medical decision making (see chart for details).  Patient presents for localized skin infection to the left inner thigh and to update her rabies vaccine.  She is unsure what may have initiated the skin infection, but is concerned that it was possibly a bat.  Patient has received a previous series of rabies vaccine in 2020, and would like to get boosters today.  On exam, patient's vital signs are stable, she is in no acute distress.  She does have 2 indurations to the left inner thigh with blistering, erythema, and drainage present.  There is no fluctuance.  We will start patient on Keflex as this was researched and found to be safe if she is trying to get pregnant.  Patient's rabies vaccine was also updated today.  Patient was advised that she will come in on 10/07/2021 for the second booster.  Supportive care recommendations were provided to the patient along with strict return precautions.  Patient verbalizes understanding.  All questions were answered. Final Clinical Impressions(s) / UC Diagnoses   Final diagnoses:  Bacterial skin infection     Discharge Instructions      Take medication as prescribed. Warm compresses to the affected area 3-4 times daily. Clean the area at least twice daily with Dial Gold bar soap or another type of antibacterial soap. Keep the area covered while it is draining. Go to the emergency department if you develop fever, chills, generalized fatigue, nausea, vomiting, or if the area of redness spreads into the genital region, or if you have foul-smelling drainage.  Follow up on 10/07/21 for 2nd rabies vaccine.     ED Prescriptions     Medication Sig Dispense Auth. Provider   cephALEXin (KEFLEX) 500 MG capsule Take 1 capsule (500 mg total) by mouth 3 (three) times daily for 7 days. 21 capsule Jaymon Dudek-Warren, Sadie Haber, NP      PDMP not reviewed this  encounter.   Abran Cantor, NP 10/04/21 1443

## 2021-10-04 NOTE — Discharge Instructions (Signed)
Take medication as prescribed. Warm compresses to the affected area 3-4 times daily. Clean the area at least twice daily with Dial Gold bar soap or another type of antibacterial soap. Keep the area covered while it is draining. Go to the emergency department if you develop fever, chills, generalized fatigue, nausea, vomiting, or if the area of redness spreads into the genital region, or if you have foul-smelling drainage.  Follow up on 10/07/21 for 2nd rabies vaccine.

## 2021-10-04 NOTE — ED Triage Notes (Addendum)
Pt reports recently moved in a new house and reports was in the attic and noticed a bat flying around. Pt reports was also working in the yard all day/evening Monday and reported noticed two bite site to left thigh. Pt reports has been using antibiotic ointment and reports "bite sites" have increased in size and drainage.   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 Pt reports consulted health department regarding possible additional bites and states was told could get two boosters and reports this UC has same brand of rabies vaccine.

## 2021-10-07 ENCOUNTER — Ambulatory Visit
Admission: RE | Admit: 2021-10-07 | Discharge: 2021-10-07 | Disposition: A | Discharge status: Home / Self Care | Payer: No Typology Code available for payment source | Source: Ambulatory Visit | Attending: Physician Assistant | Admitting: Physician Assistant

## 2021-10-07 ENCOUNTER — Other Ambulatory Visit: Payer: Self-pay

## 2021-10-07 VITALS — BP 111/77 | HR 95 | Temp 98.6°F | Resp 20

## 2021-10-07 DIAGNOSIS — L237 Allergic contact dermatitis due to plants, except food: Secondary | ICD-10-CM

## 2021-10-07 MED ORDER — RABIES VACCINE, PCEC IM SUSR
1.0000 mL | Freq: Once | INTRAMUSCULAR | Status: AC
  Administered 2021-10-07: 1 mL via INTRAMUSCULAR

## 2021-10-07 MED ORDER — MUPIROCIN 2 % EX OINT: 1.0000 | TOPICAL_OINTMENT | Freq: Two times a day (BID) | CUTANEOUS | 0 refills | Status: DC

## 2021-10-07 MED ORDER — PREDNISONE 10 MG PO TABS
ORAL_TABLET | ORAL | 0 refills | Status: DC
Start: 2021-10-07 — End: 2022-04-21

## 2021-10-07 NOTE — ED Triage Notes (Signed)
Pt returned for 2nd rabies vaccine.   Pt also reports sites have continued draining and possible poison oak/poison ivy. Pt reports would like sites assessed by provider.

## 2021-10-07 NOTE — Discharge Instructions (Signed)
Continue your antibiotics as prescribed.  I believe that this is poison ivy.  Start prednisone as prescribed.  Do not take NSAIDs including aspirin, ibuprofen/Advil, naproxen/Aleve with this medication as it can cause stomach bleeding.  Keep area clean and apply Bactroban ointment.  If your symptoms or not improving quickly please return for reevaluation.  If anything worsens and you develop fever, worsening rash, nausea/vomiting interfere with oral intake you need to be seen immediately.

## 2021-10-07 NOTE — ED Provider Notes (Signed)
[facility]    CSN: [id number] Arrival date & time: 10/07/21  1325      History   Chief Complaint Chief Complaint  Patient presents with   Wound Check    HPI Angel Diaz is a 26 y.o. female.   Patient presents today with a several day history of rash on bilateral inner legs that is spread to her trunk.  She was seen 10/04/2021 at which point she was concerned that she was bitten by a bat as she had been out in the bushes and saw a bat and noticed 2 small wounds on her left inner thigh.  She has previously had a rabies vaccine and so presents today for her second booster but is concerned because rash continues to spread.  At her last visit she was prescribed antibiotics and has been taking them as prescribed.  She has been applying triple antibiotic ointment without improvement of symptoms.  She reports intense pruritus and lesion has spread to bilateral inner thighs.  She denies any changes to personal hygiene products including soaps or detergents.  She denies any recent medication changes.  Denies any fever, nausea, vomiting.    Past Medical History:  Diagnosis Date   UTI (urinary tract infection)     Patient Active Problem List   Diagnosis Date Noted   Abnormal uterine bleeding (AUB) 01/15/2021   Pregnancy examination or test, negative result 01/15/2021   Pelvic pain 01/15/2021   Indication for care in labor or delivery 04/21/2020   Vaginal delivery 04/21/2020   Supervision of normal first pregnancy 10/06/2019    Past Surgical History:  Procedure Laterality Date   SPINE SURGERY     had growth at end of spine had removed after birth    wisdom tooth removal     2023    OB History     Gravida  1   Para  1   Term  1   Preterm  0   AB  0   Living  1      SAB  0   IAB  0   Ectopic  0   Multiple  0   Live Births  1            Home Medications    Prior to Admission medications   Medication Sig Start Date End Date Taking?  Authorizing Provider  mupirocin ointment (BACTROBAN) 2 % Apply 1 Application topically 2 (two) times daily. 10/07/21  Yes Alba Kriesel K, PA-C  predniSONE (DELTASONE) 10 MG tablet Take 4 tablets (40 mg) for days 1-3, take 3 tablets (30 mg) days 4-6, take 2 tablets (20 mg) days 7-9, take 1 tablet (10 mg) days 10-12 10/07/21  Yes Eberardo Demello K, PA-C  amoxicillin-clavulanate (AUGMENTIN) 875-125 MG tablet Take 1 tablet by mouth every 12 (twelve) hours. 01/27/21   Volney American, PA-C  cephALEXin (KEFLEX) 500 MG capsule Take 1 capsule (500 mg total) by mouth 3 (three) times daily for 7 days. 10/04/21 10/11/21  Leath-Warren, Alda Lea, NP  dicyclomine (BENTYL) 10 MG capsule Take 1 capsule (10 mg total) by mouth 3 (three) times daily before meals. 01/18/21   Estill Dooms, NP  EVENING PRIMROSE OIL PO Take by mouth.    [provider]  megestrol (MEGACE) 40 MG tablet Take 3 x 5 days then 2 x 5 days then 1 daily till bleeding stops 01/18/21   Derrek Monaco A, NP  Multiple Vitamin (MULTIVITAMIN) tablet Take 1 tablet  by mouth daily.    [provider]  UNABLE TO FIND Violet Daily    [provider]    Family History Family History  Problem Relation Age of Onset   Heart disease Paternal Grandmother    Diabetes Father    Cancer Cousin        breast    Social History Social History   Tobacco Use   Smoking status: Never   Smokeless tobacco: Never  Vaping Use   Vaping Use: Never used  Substance Use Topics   Alcohol use: Not Currently    Comment: rarely   Drug use: Never     Allergies   Sulfa antibiotics   Review of Systems Review of Systems  Constitutional:  Positive for activity change. Negative for appetite change, fatigue and fever.  Gastrointestinal:  Negative for abdominal pain, diarrhea, nausea and vomiting.  Musculoskeletal:  Negative for arthralgias and myalgias.  Skin:  Positive for rash.  Neurological:  Negative for dizziness,  light-headedness and headaches.     Physical Exam Triage Vital Signs ED Triage Vitals  Enc Vitals Group     BP 10/07/21 1344 111/77     Pulse Rate 10/07/21 1344 95     Resp 10/07/21 1344 20     Temp 10/07/21 1344 98.6 F (37 C)     Temp Source 10/07/21 1344 Oral     SpO2 10/07/21 1344 98 %     Weight --      Height --      Head Circumference --      Peak Flow --      Pain Score 10/07/21 1345 2     Pain Loc --      Pain Edu? --      Excl. in [place]? --    No data found.  Updated Vital Signs BP 111/77 (BP Location: Right Arm)   Pulse 95   Temp 98.6 F (37 C) (Oral)   Resp 20   LMP 09/10/2021 (Exact Date)   SpO2 98%   Breastfeeding No   Visual Acuity Right Eye Distance:   Left Eye Distance:   Bilateral Distance:    Right Eye Near:   Left Eye Near:    Bilateral Near:     Physical Exam Vitals reviewed.  Constitutional:      General: She is awake. She is not in acute distress.    Appearance: Normal appearance. She is well-developed. She is not ill-appearing.     Comments: Very pleasant female appears stated age in no acute distress sitting comfortably in exam room  HENT:     Head: Normocephalic and atraumatic.  Cardiovascular:     Rate and Rhythm: Normal rate and regular rhythm.     Heart sounds: Normal heart sounds, S1 normal and S2 normal. No murmur heard. Pulmonary:     Effort: Pulmonary effort is normal.     Breath sounds: Normal breath sounds. No wheezing, rhonchi or rales.     Comments: Clear to auscultation bilaterally Skin:    Findings: Rash present. Rash is macular, papular and vesicular.     Comments: Maculopapular rash with serous oozing noted left thigh.  Maculopapular rash with multiple fine vesicles noted medial right thigh.  No streaking or evidence of lymphangitis.  Psychiatric:        Behavior: Behavior is cooperative.      UC Treatments / Results  Labs (all labs ordered are listed, but only abnormal results are displayed) Labs Reviewed -  No data to display  EKG   Radiology No results found.  Procedures Procedures (including critical care time)  Medications Ordered in UC Medications  rabies vaccine (RABAVERT) injection 1 mL (1 mL Intramuscular Given 10/07/21 1347)    Initial Impression / Assessment and Plan / UC Course  I have reviewed the triage vital signs and the nursing notes.  Pertinent labs & imaging results that were available during my care of the patient were reviewed by me and considered in my medical decision making (see chart for details).     Patient completed rabies boosters today.  I am suspicious for Rhus dermatitis given appearance of rash despite antibiotics.  Patient was encouraged to complete course of antibiotics as prescribed and keep area clean.  We will switch from over-the-counter triple antibiotic ointment to Bactroban in case an allergic component is contributing to ongoing symptoms.  We will start prednisone taper to help manage symptoms.  Discussed that she should not take NSAIDs with this medication due to risk of GI bleeding.  She is to keep area clean with soap and water.  Use recommended hypoallergenic soaps and detergents.  If she develops any additional symptoms including spread of rash, fever, nausea/vomiting, body aches she needs to be seen immediately.  Strict return precautions given to which she expressed understanding.  Final Clinical Impressions(s) / UC Diagnoses   Final diagnoses:  Poison ivy     Discharge Instructions      Continue your antibiotics as prescribed.  I believe that this is poison ivy.  Start prednisone as prescribed.  Do not take NSAIDs including aspirin, ibuprofen/Advil, naproxen/Aleve with this medication as it can cause stomach bleeding.  Keep area clean and apply Bactroban ointment.  If your symptoms or not improving quickly please return for reevaluation.  If anything worsens and you develop fever, worsening rash, nausea/vomiting interfere with oral  intake you need to be seen immediately.     ED Prescriptions     Medication Sig Dispense Auth. Provider   predniSONE (DELTASONE) 10 MG tablet Take 4 tablets (40 mg) for days 1-3, take 3 tablets (30 mg) days 4-6, take 2 tablets (20 mg) days 7-9, take 1 tablet (10 mg) days 10-12 30 tablet Zierra Laroque K, PA-C   mupirocin ointment (BACTROBAN) 2 % Apply 1 Application topically 2 (two) times daily. 22 g Vickie Melnik K, PA-C      PDMP not reviewed this encounter.   Jeani Hawking, PA-C 10/07/21 1447

## 2022-01-09 DIAGNOSIS — Z23 Encounter for immunization: Secondary | ICD-10-CM | POA: Diagnosis not present

## 2022-01-14 DIAGNOSIS — Z6830 Body mass index (BMI) 30.0-30.9, adult: Secondary | ICD-10-CM | POA: Diagnosis not present

## 2022-01-14 DIAGNOSIS — R109 Unspecified abdominal pain: Secondary | ICD-10-CM | POA: Diagnosis not present

## 2022-01-17 DIAGNOSIS — R109 Unspecified abdominal pain: Secondary | ICD-10-CM | POA: Diagnosis not present

## 2022-01-28 NOTE — L&D Delivery Note (Signed)
[facility] Delivery Note  Angel Diaz is a 27 y.o. G2P1001 s/p SVD at [redacted]w[redacted]d. She was admitted for ROM.   ROM: 14h 74m with clear fluid GBS Status:  Positive/-- (09/19 1130) Maximum Maternal Temperature:  Temp (24hrs), Avg:98.4 F (36.9 C), Min:98.1 F (36.7 C), Max:98.6 F (37 C)    Labor Progress: Patient arrived at 1 cm dilation and was induced with pitocin.   Delivery Date/Time: 11/11/2022 at 1113 Delivery: Called to room and patient was complete and pushing. Head delivered in direct OA  position. No nuchal cord present. Shoulder and body delivered in usual fashion. Infant with spontaneous cry, placed on mother's abdomen, dried and stimulated. Cord clamped x 2 after 1-minute delay, and cut by FOB. Cord blood drawn. Placenta delivered spontaneously with gentle cord traction. Fundus firm with massage and Pitocin. Labia, perineum, vagina, and cervix inspected with 1st degree laceration repaired with 3-0 monocryl.   Placenta:  spontaneous, intact, 3 vessel cord  Complications: None Lacerations: 1st degree perineal repaired with 3-0 monocryl  EBL: 167 mL Analgesia: epidural and local anesthesia    Infant: APGAR (1 MIN): 9  APGAR (5 MINS): 9  APGAR (10 MINS):    Weight: Pending   Derrel Nip, MD  11/11/2022 11:39 AM\

## 2022-02-14 DIAGNOSIS — D485 Neoplasm of uncertain behavior of skin: Secondary | ICD-10-CM | POA: Diagnosis not present

## 2022-02-14 DIAGNOSIS — D225 Melanocytic nevi of trunk: Secondary | ICD-10-CM | POA: Diagnosis not present

## 2022-02-14 DIAGNOSIS — B078 Other viral warts: Secondary | ICD-10-CM | POA: Diagnosis not present

## 2022-02-25 DIAGNOSIS — H43823 Vitreomacular adhesion, bilateral: Secondary | ICD-10-CM | POA: Diagnosis not present

## 2022-02-25 DIAGNOSIS — H35413 Lattice degeneration of retina, bilateral: Secondary | ICD-10-CM | POA: Diagnosis not present

## 2022-03-08 ENCOUNTER — Ambulatory Visit (INDEPENDENT_AMBULATORY_CARE_PROVIDER_SITE_OTHER): Payer: BC Managed Care – PPO | Admitting: *Deleted

## 2022-03-08 DIAGNOSIS — Z3201 Encounter for pregnancy test, result positive: Secondary | ICD-10-CM

## 2022-03-08 LAB — POCT URINE PREGNANCY: Preg Test, Ur: POSITIVE — AB

## 2022-03-08 NOTE — Progress Notes (Signed)
   NURSE VISIT- PREGNANCY CONFIRMATION   SUBJECTIVE:  Angel Diaz is a 27 y.o. G2P1001 female at [redacted]w[redacted]d by certain LMP of Patient's last menstrual period was 01/29/2022. Here for pregnancy confirmation.  Home pregnancy test: positive x 30   She reports no complaints.  She is taking prenatal vitamins.    OBJECTIVE:  LMP 01/29/2022   Appears well, in no apparent distress  Results for orders placed or performed in visit on 03/08/22 (from the past 24 hour(s))  POCT urine pregnancy   Collection Time: 03/08/22 11:10 AM  Result Value Ref Range   Preg Test, Ur Positive (A) Negative    ASSESSMENT: Positive pregnancy test, [redacted]w[redacted]d by LMP    PLAN: Schedule for dating ultrasound in 3-4 weeks Prenatal vitamins: continue   Nausea medicines: not currently needed   OB packet given: Yes  Janece Canterbury  03/08/2022 11:10 AM

## 2022-03-15 ENCOUNTER — Telehealth: Payer: Self-pay | Admitting: *Deleted

## 2022-03-15 NOTE — Telephone Encounter (Signed)
Patient called stating she noticed dark brown discharge 2 days ago.  It shopped for a day but has now returned and is still light.  States she has not had intercourse since she got pregnant but did carry a vacuum cleaner up a flight of stairs this morning.  She is having mild cramping.  Advised patient to continue to monitor since bleeding is light and spotty and if bleeding becomes heavy like a period, to let us know or go to the hospital.  Advised no sex for 7 days and push fluids.  Pt verbalized understanding with not further questions.

## 2022-03-19 ENCOUNTER — Telehealth: Payer: Self-pay

## 2022-03-19 DIAGNOSIS — O209 Hemorrhage in early pregnancy, unspecified: Secondary | ICD-10-CM

## 2022-03-19 NOTE — Telephone Encounter (Signed)
Patient would like for a nurse to call her about some concerns about her pregnancy, patient stated that she had bleeding last week.

## 2022-03-19 NOTE — Telephone Encounter (Signed)
Patient states she bleed some earlier last week and it stopped but had bleeding again this weekend.  She was having some pregnancy symptoms but is not now.  Discussed with JAG and will order HCG.  Informed patient based on level, may need repeat in 48 hours.  Pt verbalized understanding with no further questions.

## 2022-03-20 ENCOUNTER — Other Ambulatory Visit: Payer: Self-pay | Admitting: Adult Health

## 2022-03-20 DIAGNOSIS — O209 Hemorrhage in early pregnancy, unspecified: Secondary | ICD-10-CM

## 2022-03-20 LAB — BETA HCG QUANT (REF LAB): hCG Quant: 17026 m[IU]/mL

## 2022-03-20 NOTE — Progress Notes (Signed)
Ck QHCG

## 2022-03-21 ENCOUNTER — Other Ambulatory Visit: Payer: BC Managed Care – PPO

## 2022-03-21 DIAGNOSIS — O209 Hemorrhage in early pregnancy, unspecified: Secondary | ICD-10-CM | POA: Diagnosis not present

## 2022-03-22 ENCOUNTER — Telehealth: Payer: Self-pay | Admitting: Adult Health

## 2022-03-22 ENCOUNTER — Other Ambulatory Visit: Payer: Self-pay | Admitting: Adult Health

## 2022-03-22 DIAGNOSIS — O209 Hemorrhage in early pregnancy, unspecified: Secondary | ICD-10-CM

## 2022-03-22 LAB — BETA HCG QUANT (REF LAB): hCG Quant: 22147 m[IU]/mL

## 2022-03-22 NOTE — Telephone Encounter (Signed)
Pt states she is spotting. Please advise

## 2022-03-22 NOTE — Telephone Encounter (Signed)
Pt is pregnant and is spotting. I spoke with Jenn. Pt to have repeat quant on Monday. Has [place] scheduled for 3/4. Do pelvic rest this weekend. No sex and no heavy lifting. Just take it easy. Have blood test repeated on Monday and keep [place] appt. Pt voiced understanding. [place]

## 2022-03-25 DIAGNOSIS — O209 Hemorrhage in early pregnancy, unspecified: Secondary | ICD-10-CM | POA: Diagnosis not present

## 2022-03-26 LAB — BETA HCG QUANT (REF LAB): hCG Quant: 36666 m[IU]/mL

## 2022-03-28 ENCOUNTER — Telehealth: Payer: Self-pay | Admitting: Adult Health

## 2022-03-28 NOTE — Telephone Encounter (Signed)
Pt is pregnant. Pt had a pinching pain in pelvis. Noticed blood when wiping after a BM. Pt has been constipated. I spoke with Dr. Rip Harbour. Blood is more than likely from BM or hemorrhoids. If pt starts having heavy bleeding, soaking a pad in an hour, go to [facility] for evaluation. No sex for 7-10 days from the last time she wipes any color. Drink plenty of water and take it easy. Keep appt for 3/4 for [place]. Pt voiced understanding. [place]

## 2022-03-28 NOTE — Telephone Encounter (Signed)
Patient couldn't reply back to the message you sent on hcg labs. Patient has had some pinching pain, and has had blood on tissue when wiping. Please advise.

## 2022-03-29 ENCOUNTER — Other Ambulatory Visit: Payer: Self-pay | Admitting: Obstetrics & Gynecology

## 2022-03-29 DIAGNOSIS — O3680X0 Pregnancy with inconclusive fetal viability, not applicable or unspecified: Secondary | ICD-10-CM

## 2022-04-01 ENCOUNTER — Ambulatory Visit (INDEPENDENT_AMBULATORY_CARE_PROVIDER_SITE_OTHER): Payer: BC Managed Care – PPO

## 2022-04-01 DIAGNOSIS — Z3A08 8 weeks gestation of pregnancy: Secondary | ICD-10-CM | POA: Diagnosis not present

## 2022-04-01 DIAGNOSIS — O3680X0 Pregnancy with inconclusive fetal viability, not applicable or unspecified: Secondary | ICD-10-CM

## 2022-04-01 NOTE — Progress Notes (Signed)
[place] 8 wks,single IUP with yolk sac,FHR 167 bpm,normal,ovaries,CRL 15.88 mm

## 2022-04-15 ENCOUNTER — Other Ambulatory Visit: Payer: Self-pay | Admitting: Advanced Practice Midwife

## 2022-04-15 ENCOUNTER — Telehealth: Payer: Self-pay

## 2022-04-15 DIAGNOSIS — Z882 Allergy status to sulfonamides status: Secondary | ICD-10-CM | POA: Diagnosis not present

## 2022-04-15 DIAGNOSIS — Z3A10 10 weeks gestation of pregnancy: Secondary | ICD-10-CM | POA: Diagnosis not present

## 2022-04-15 DIAGNOSIS — R197 Diarrhea, unspecified: Secondary | ICD-10-CM | POA: Diagnosis not present

## 2022-04-15 DIAGNOSIS — O99891 Other specified diseases and conditions complicating pregnancy: Secondary | ICD-10-CM | POA: Diagnosis not present

## 2022-04-15 DIAGNOSIS — Z1152 Encounter for screening for COVID-19: Secondary | ICD-10-CM | POA: Diagnosis not present

## 2022-04-15 DIAGNOSIS — R112 Nausea with vomiting, unspecified: Secondary | ICD-10-CM | POA: Diagnosis not present

## 2022-04-15 DIAGNOSIS — R8290 Unspecified abnormal findings in urine: Secondary | ICD-10-CM | POA: Diagnosis not present

## 2022-04-15 DIAGNOSIS — O219 Vomiting of pregnancy, unspecified: Secondary | ICD-10-CM | POA: Diagnosis not present

## 2022-04-15 MED ORDER — ONDANSETRON HCL 4 MG PO TABS: 4.0000 mg | ORAL_TABLET | Freq: Four times a day (QID) | ORAL | 2 refills | Status: DC | PRN

## 2022-04-15 NOTE — Telephone Encounter (Signed)
Patient wants to know if she can get a prescription for Zofran.

## 2022-04-21 ENCOUNTER — Other Ambulatory Visit: Payer: Self-pay

## 2022-04-21 ENCOUNTER — Inpatient Hospital Stay (HOSPITAL_COMMUNITY)
Admission: AD | Admit: 2022-04-21 | Discharge: 2022-04-21 | Disposition: A | Discharge status: Home / Self Care | Payer: BC Managed Care – PPO | Attending: Obstetrics & Gynecology | Admitting: Obstetrics & Gynecology

## 2022-04-21 DIAGNOSIS — B9689 Other specified bacterial agents as the cause of diseases classified elsewhere: Secondary | ICD-10-CM

## 2022-04-21 DIAGNOSIS — O209 Hemorrhage in early pregnancy, unspecified: Secondary | ICD-10-CM | POA: Diagnosis not present

## 2022-04-21 DIAGNOSIS — Z3A10 10 weeks gestation of pregnancy: Secondary | ICD-10-CM | POA: Insufficient documentation

## 2022-04-21 DIAGNOSIS — O23591 Infection of other part of genital tract in pregnancy, first trimester: Secondary | ICD-10-CM | POA: Insufficient documentation

## 2022-04-21 DIAGNOSIS — O99611 Diseases of the digestive system complicating pregnancy, first trimester: Secondary | ICD-10-CM | POA: Diagnosis not present

## 2022-04-21 DIAGNOSIS — K5900 Constipation, unspecified: Secondary | ICD-10-CM | POA: Diagnosis not present

## 2022-04-21 LAB — URINALYSIS, ROUTINE W REFLEX MICROSCOPIC
Bilirubin Urine: NEGATIVE
Glucose, UA: NEGATIVE mg/dL
Hgb urine dipstick: NEGATIVE
Ketones, ur: NEGATIVE mg/dL
Leukocytes,Ua: NEGATIVE
Nitrite: NEGATIVE
Protein, ur: NEGATIVE mg/dL
Specific Gravity, Urine: 1.024 (ref 1.005–1.030)
pH: 6 (ref 5.0–8.0)

## 2022-04-21 LAB — WET PREP, GENITAL
Sperm: NONE SEEN
Trich, Wet Prep: NONE SEEN
WBC, Wet Prep HPF POC: <10 (ref <10)
Yeast Wet Prep HPF POC: NONE SEEN

## 2022-04-21 MED ORDER — METRONIDAZOLE 0.75 % VA GEL: 1.0000 | Freq: Every day | VAGINAL | 0 refills | Status: DC

## 2022-04-21 NOTE — MAU Provider Note (Signed)
History     CSN: [id number]  Arrival date and time: 04/21/22 1303   Event Date/Time   First Provider Initiated Contact with Patient 04/21/22 1356      Chief Complaint  Patient presents with   Vaginal Bleeding   Pelvic Pain   Angel Diaz is a 27 y.o. G2P1001 at [redacted]w[redacted]d who receives care at [facility]. She reports her next appt is Wednesday  April 3rd.  She presents today for vaginal spotting.  She states this has been an ongoing issue throughout the pregnancy.  She states it occurred 2 nights ago and then last night she noted blood in the toilet, which was more than usual.  She states she has concluded that the blood is coming from her cervix. She states that she continues to have blood with wiping.  She endorses blood upon arrival. She requests a pelvic exam for evaluation.  She reports some "pinching pain" in her pelvis, which she contributes to "growing pains."  She denies recent sexual activity. She is currently taking an antibiotic for a UTI.   OB History     Gravida  2   Para  1   Term  1   Preterm  0   AB  0   Living  1      SAB  0   IAB  0   Ectopic  0   Multiple  0   Live Births  1           Past Medical History:  Diagnosis Date   UTI (urinary tract infection)     Past Surgical History:  Procedure Laterality Date   SPINE SURGERY     had growth at end of spine had removed after birth    wisdom tooth removal     2023    Family History  Problem Relation Age of Onset   Heart disease Paternal Grandmother    Diabetes Father    Cancer Cousin        breast    Social History   Tobacco Use   Smoking status: Never   Smokeless tobacco: Never  Vaping Use   Vaping Use: Never used  Substance Use Topics   Alcohol use: Not Currently    Comment: rarely   Drug use: Never    Allergies:  Allergies  Allergen Reactions   Sulfa Antibiotics Rash    Medications Prior to Admission  Medication Sig Dispense Refill Last Dose    amoxicillin-clavulanate (AUGMENTIN) 875-125 MG tablet Take 1 tablet by mouth every 12 (twelve) hours. 14 tablet 0    dicyclomine (BENTYL) 10 MG capsule Take 1 capsule (10 mg total) by mouth 3 (three) times daily before meals. 90 capsule 1    EVENING PRIMROSE OIL PO Take by mouth.      megestrol (MEGACE) 40 MG tablet Take 3 x 5 days then 2 x 5 days then 1 daily till bleeding stops 45 tablet 0    Multiple Vitamin (MULTIVITAMIN) tablet Take 1 tablet by mouth daily.      mupirocin ointment (BACTROBAN) 2 % Apply 1 Application topically 2 (two) times daily. 22 g 0    ondansetron (ZOFRAN) 4 MG tablet Take 1 tablet (4 mg total) by mouth every 6 (six) hours as needed for nausea or vomiting. 30 tablet 2    predniSONE (DELTASONE) 10 MG tablet Take 4 tablets (40 mg) for days 1-3, take 3 tablets (30 mg) days 4-6, take 2 tablets (20 mg) days 7-9, take 1  tablet (10 mg) days 10-12 30 tablet 0    UNABLE TO FIND Violet Daily       Review of Systems  Gastrointestinal:  Positive for constipation (Easing up). Negative for diarrhea, nausea and vomiting.  Genitourinary:  Positive for pelvic pain (Pinching), vaginal bleeding and vaginal discharge (White yellow, No odor or irritation). Negative for difficulty urinating and dysuria.   Physical Exam   Blood pressure 129/75, pulse 98, temperature 98.5 F (36.9 C), temperature source Oral, resp. rate 20, height 5\' 5"  (1.651 m), weight 84 kg, last menstrual period 01/29/2022, SpO2 100 %, not currently breastfeeding.  Physical Exam Vitals reviewed. Exam conducted with a chaperone present.  Constitutional:      Appearance: Normal appearance.  HENT:     Head: Normocephalic and atraumatic.  Eyes:     Conjunctiva/sclera: Conjunctivae normal.  Cardiovascular:     Rate and Rhythm: Normal rate.  Pulmonary:     Effort: Pulmonary effort is normal. No respiratory distress.  Genitourinary:    Comments: Speculum Exam: -Normal External Genitalia: Non tender, no apparent  discharge or blood noted at introitus.  -Vaginal Vault: Pink mucosa with good rugae. No blood noted. Scant amt grayish white -wet prep collected -Cervix:Pink, no lesions, cysts, or polyps. Ecotropic appearance. Scant amt light pink blood noted, from ~ 2'o'clock, when faux swab applied to cervix. Appears closed. No active bleeding from os-GC/CT collected -Bimanual Exam:  Deferred Musculoskeletal:        General: Normal range of motion.     Cervical back: Normal range of motion.  Skin:    General: Skin is warm and dry.  Neurological:     Mental Status: She is alert and oriented to person, place, and time.  Psychiatric:        Mood and Affect: Mood normal.        Behavior: Behavior normal.    FHTs 170 by doppler MAU Course  Procedures Results for orders placed or performed during the hospital encounter of 04/21/22 (from the past 24 hour(s))  Wet prep, genital     Status: Abnormal   Collection Time: 04/21/22  1:37 PM   Specimen: PATH Cytology Cervicovaginal Ancillary Only  Result Value Ref Range   Yeast Wet Prep HPF POC NONE SEEN NONE SEEN   Trich, Wet Prep NONE SEEN NONE SEEN   Clue Cells Wet Prep HPF POC PRESENT (A) NONE SEEN   WBC, Wet Prep HPF POC <10 <10   Sperm NONE SEEN   Urinalysis, Routine w reflex microscopic -Urine, Clean Catch     Status: Abnormal   Collection Time: 04/21/22  1:42 PM  Result Value Ref Range   Color, Urine YELLOW YELLOW   APPearance HAZY (A) CLEAR   Specific Gravity, Urine 1.024 1.005 - 1.030   pH 6.0 5.0 - 8.0   Glucose, UA NEGATIVE NEGATIVE mg/dL   Hgb urine dipstick NEGATIVE NEGATIVE   Bilirubin Urine NEGATIVE NEGATIVE   Ketones, ur NEGATIVE NEGATIVE mg/dL   Protein, ur NEGATIVE NEGATIVE mg/dL   Nitrite NEGATIVE NEGATIVE   Leukocytes,Ua NEGATIVE NEGATIVE    MDM Pelvic Exam; Wet Prep and GC/CT Labs: UA Prescription Assessment and Plan  27 year old  G2P1001 at 10.6 weeks Vaginal Spotting Bacterial Vaginosis Constipation  -POC  Reviewed. -Exam performed and findings discussed. -Cultures collected and return positive for BV. -Discussed treatment and how BV can cause some spotting in pregnancy. -Further discussed how hemorrhoids can cause some bouts of bleeding.  However, no external hemorrhoids noted. -Reassured  that no blood noted in vaginal vault.  -Rx for metrogel sent to pharmacy on file.  -Instructed to monitor symptoms and report as appropriate. -Keep next appt as scheduled.  -Encouraged to call primary office or return to MAU if symptoms worsen or with the onset of new symptoms. -Discharged to home in stable condition.  Maryann Conners 04/21/2022, 2:02 PM

## 2022-04-21 NOTE — MAU Note (Signed)
Angel Diaz is a 27 y.o. at [redacted]w[redacted]d here in MAU reporting: she's having spotting with wiping for past 2 days, reports it's with each time she wipes.  Also c/o lower back and pelvic pain with lateral abdominal cramping.  Reports hasn't taken meds to treat pain/cramping.  Reports currently taking antibiotics for UTI. LMP: NA Onset of complaint: 2 days  Pain score: 3 Vitals:   04/21/22 1347  BP: 129/75  Pulse: 98  Resp: 20  Temp: 98.5 F (36.9 C)  SpO2: 100%     FHT:170 bpm Lab orders placed from triage:   UA

## 2022-04-22 LAB — GC/CHLAMYDIA PROBE AMP (~~LOC~~) NOT AT ARMC
Chlamydia: NEGATIVE
Comment: NEGATIVE
Comment: NORMAL
Neisseria Gonorrhea: NEGATIVE

## 2022-04-23 ENCOUNTER — Encounter: Payer: Self-pay | Admitting: Advanced Practice Midwife

## 2022-04-27 IMAGING — US US EXTREM LOW VENOUS*L*
1 series · 14 of 24 positions shown · non-contrast
Comparison: None.

CLINICAL DATA: Flank pain

EXAM:
LEFT LOWER EXTREMITY VENOUS DOPPLER ULTRASOUND
TECHNIQUE: Gray-scale sonography with compression, as well as color and duplex
ultrasound, were performed to evaluate the deep venous system(s)
from the level of the common femoral vein through the popliteal and
proximal calf veins.

[Series 1: us venous img lower uni left (dvt) · portal-venous · 14 of 39 slices shown]
[im 1/39]
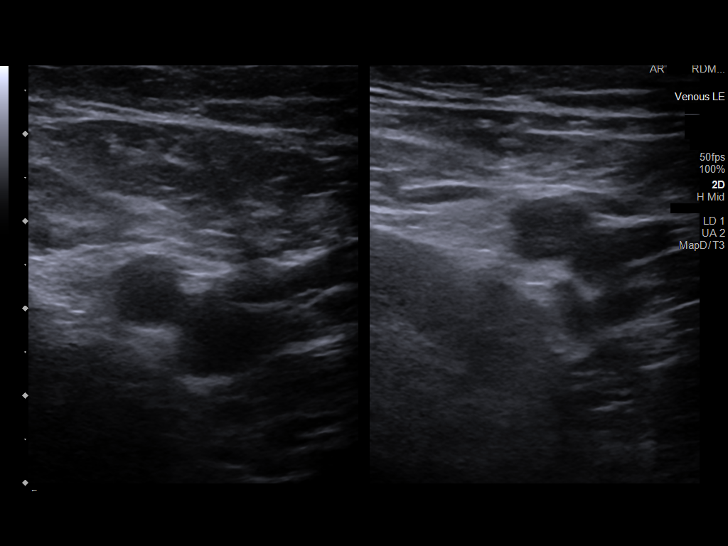
[im 4/39]
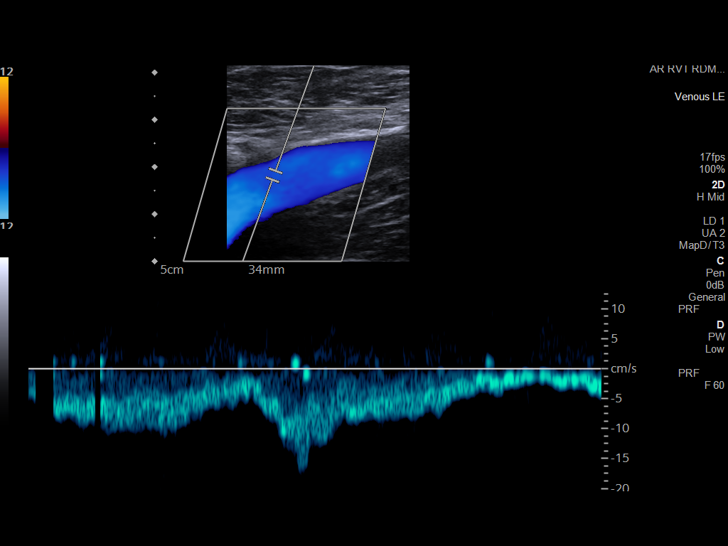
[im 7/39]
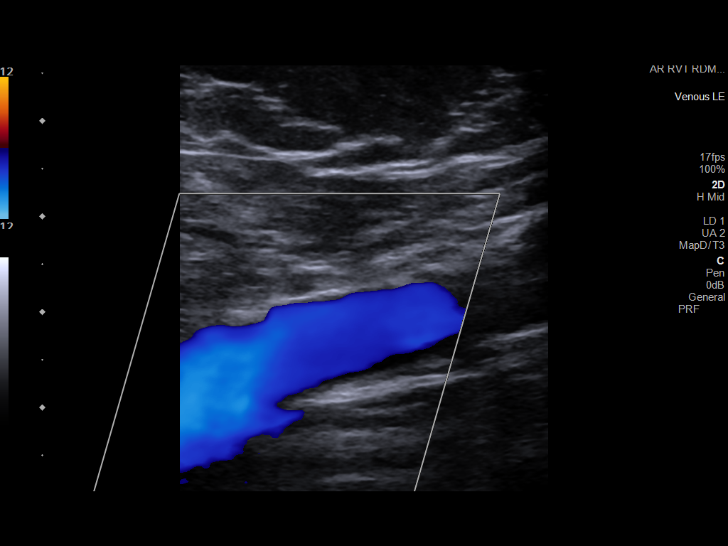
[im 10/39]
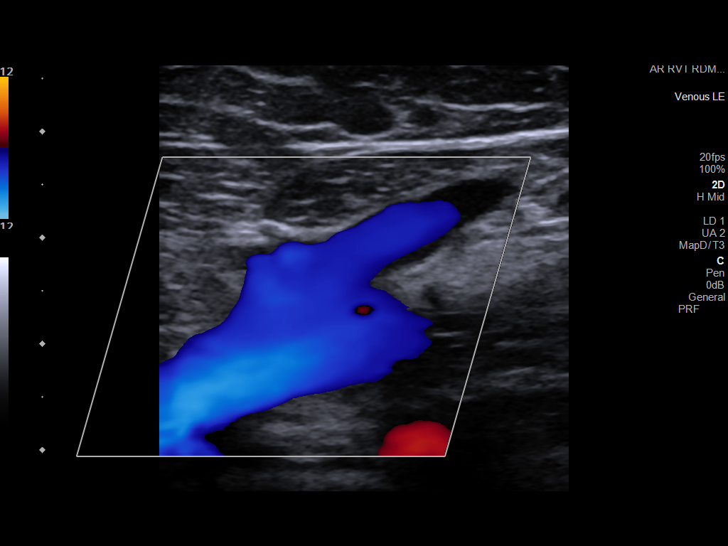
[im 12/39]
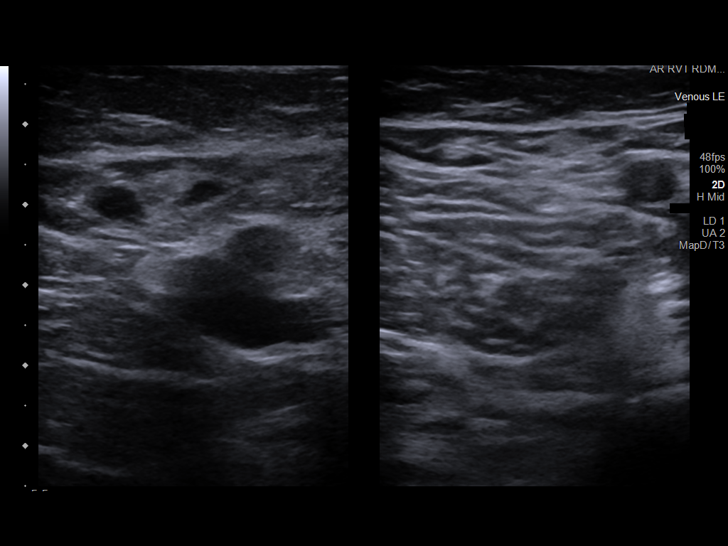
[im 15/39]
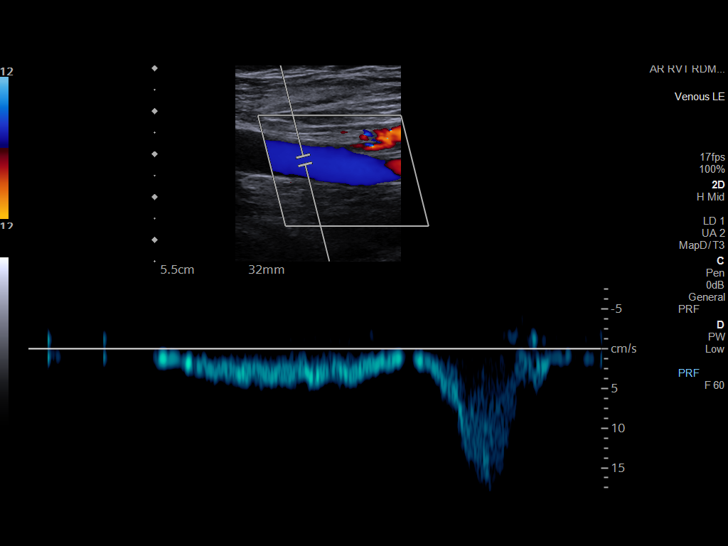
[im 19/39]
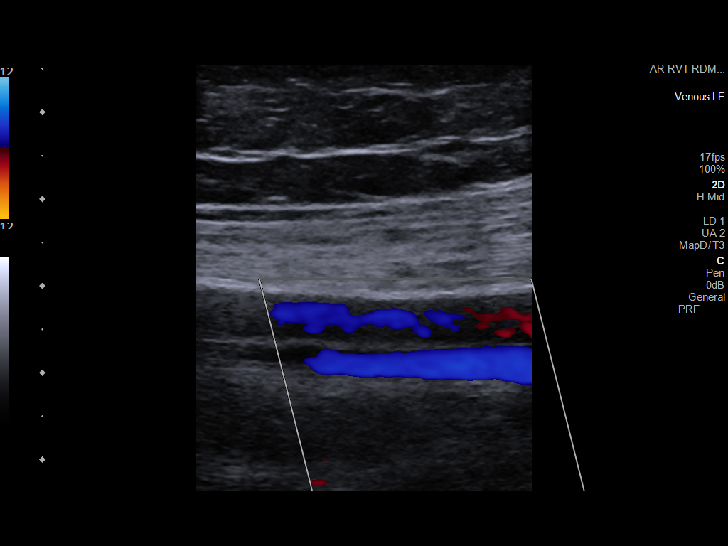
[im 20/39]
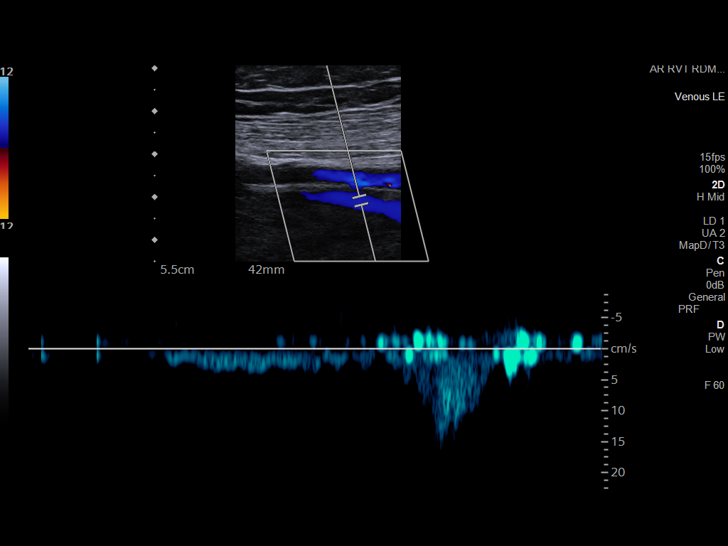
[im 24/39]
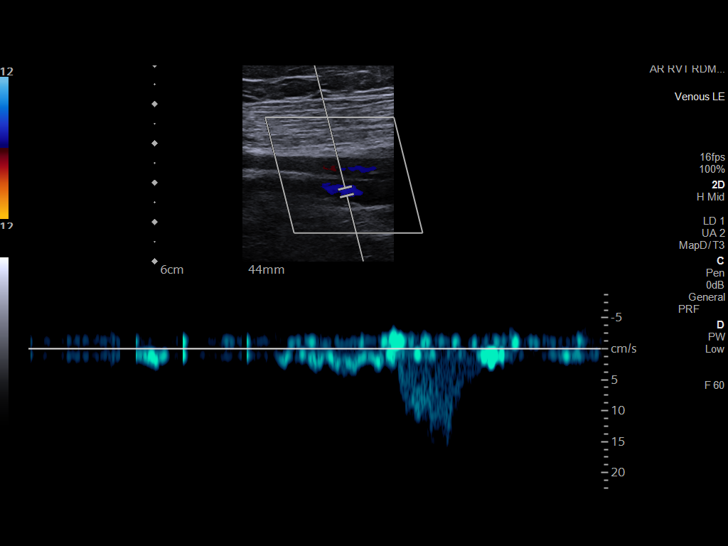
[im 27/39]
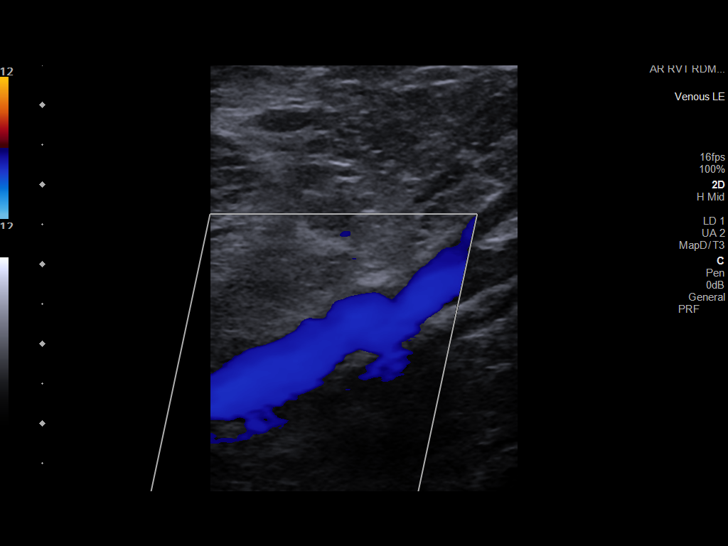
[im 30/39]
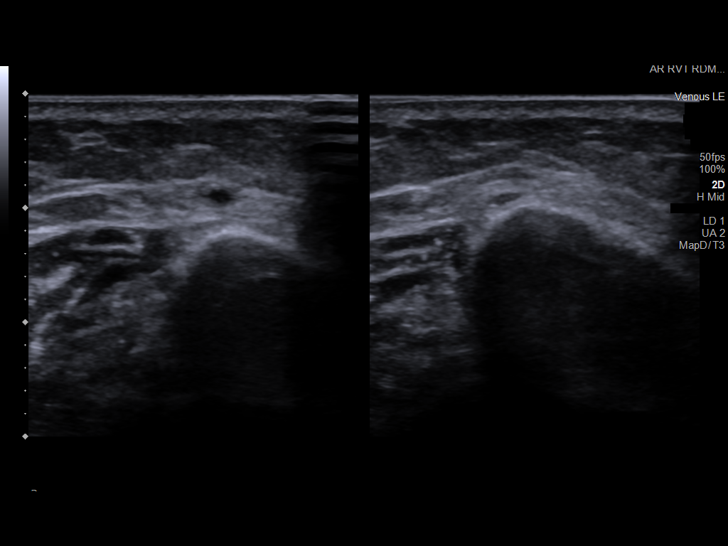
[im 32/39]
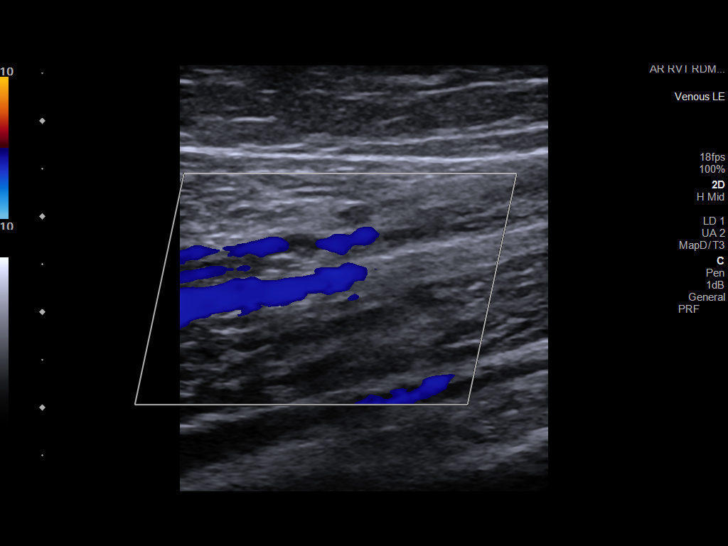
[im 35/39]
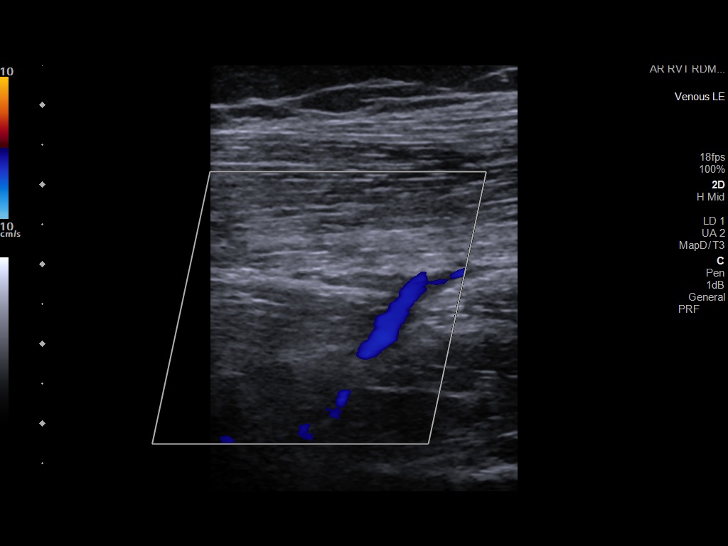
[im 39/39]
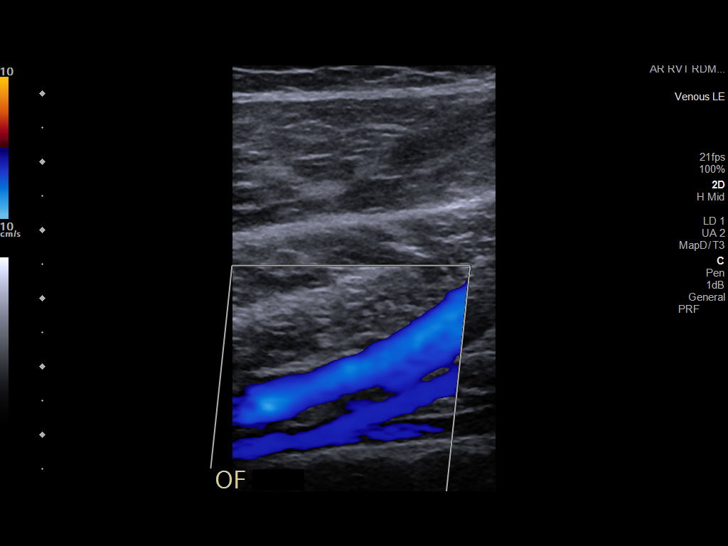

[14 of 24 positions shown; findings below may reference images not displayed]

FINDINGS: VENOUS

Normal compressibility of the common femoral, superficial femoral,
and popliteal veins, as well as the visualized calf veins.
Visualized portions of profunda femoral vein and great saphenous
vein unremarkable. No filling defects to suggest DVT on grayscale or
color Doppler imaging. Doppler waveforms show normal direction of
venous flow, normal respiratory plasticity and response to
augmentation.

Limited views of the contralateral common femoral vein are
unremarkable.

OTHER

None.

Limitations: none
IMPRESSION: Negative.

## 2022-04-29 DIAGNOSIS — Z3490 Encounter for supervision of normal pregnancy, unspecified, unspecified trimester: Secondary | ICD-10-CM | POA: Insufficient documentation

## 2022-04-30 ENCOUNTER — Other Ambulatory Visit: Payer: Self-pay | Admitting: Obstetrics & Gynecology

## 2022-04-30 DIAGNOSIS — Z3682 Encounter for antenatal screening for nuchal translucency: Secondary | ICD-10-CM

## 2022-05-01 ENCOUNTER — Ambulatory Visit (INDEPENDENT_AMBULATORY_CARE_PROVIDER_SITE_OTHER): Payer: BC Managed Care – PPO | Admitting: Advanced Practice Midwife

## 2022-05-01 ENCOUNTER — Encounter: Payer: BC Managed Care – PPO | Admitting: *Deleted

## 2022-05-01 ENCOUNTER — Other Ambulatory Visit (HOSPITAL_COMMUNITY)
Admission: RE | Admit: 2022-05-01 | Discharge: 2022-05-01 | Disposition: A | Discharge status: Home / Self Care | Payer: BC Managed Care – PPO | Source: Ambulatory Visit | Attending: Advanced Practice Midwife | Admitting: Advanced Practice Midwife

## 2022-05-01 ENCOUNTER — Encounter: Payer: Self-pay | Admitting: Advanced Practice Midwife

## 2022-05-01 ENCOUNTER — Ambulatory Visit (INDEPENDENT_AMBULATORY_CARE_PROVIDER_SITE_OTHER): Payer: BC Managed Care – PPO

## 2022-05-01 VITALS — BP 115/73 | HR 92 | Wt 186.8 lb

## 2022-05-01 DIAGNOSIS — Z363 Encounter for antenatal screening for malformations: Secondary | ICD-10-CM

## 2022-05-01 DIAGNOSIS — Z113 Encounter for screening for infections with a predominantly sexual mode of transmission: Secondary | ICD-10-CM

## 2022-05-01 DIAGNOSIS — Z3480 Encounter for supervision of other normal pregnancy, unspecified trimester: Secondary | ICD-10-CM

## 2022-05-01 DIAGNOSIS — Z124 Encounter for screening for malignant neoplasm of cervix: Secondary | ICD-10-CM | POA: Insufficient documentation

## 2022-05-01 DIAGNOSIS — Z3682 Encounter for antenatal screening for nuchal translucency: Secondary | ICD-10-CM | POA: Diagnosis not present

## 2022-05-01 DIAGNOSIS — Z3481 Encounter for supervision of other normal pregnancy, first trimester: Secondary | ICD-10-CM | POA: Diagnosis not present

## 2022-05-01 DIAGNOSIS — Z3A12 12 weeks gestation of pregnancy: Secondary | ICD-10-CM

## 2022-05-01 NOTE — Progress Notes (Signed)
[place] 12+2 wks,measurements c/w dates,CRL65.85 mm, posterior placenta,NB present,normal ovaries,FHR 162 bpm,unable to obtain NT because of fetal position

## 2022-05-01 NOTE — Patient Instructions (Signed)
Angel Diaz, thank you for choosing our office today! We appreciate the opportunity to meet your healthcare needs. You may receive a short survey by mail, e-mail, or through [employer]. If you are happy with your care we would appreciate if you could take just a few minutes to complete the survey questions. We read all of your comments and take your feedback very seriously. Thank you again for choosing our office.  [facility] ([place], [place] [address]) Entrance C, located off of [address] parking   Nausea & Vomiting Have saltine crackers or pretzels by your bed and eat a few bites before you raise your head out of bed in the morning Eat small frequent meals throughout the day instead of large meals Drink plenty of fluids throughout the day to stay hydrated, just don't drink a lot of fluids with your meals.  This can make your stomach fill up faster making you feel sick Do not brush your teeth right after you eat Products with real ginger are good for nausea, like ginger ale and ginger hard candy Make sure it says made with real ginger! Sucking on sour candy like lemon heads is also good for nausea If your prenatal vitamins make you nauseated, take them at night so you will sleep through the nausea Sea Bands If you feel like you need medicine for the nausea & vomiting please let us know If you are unable to keep any fluids or food down please let us know   Constipation Drink plenty of fluid, preferably water, throughout the day Eat foods high in fiber such as fruits, vegetables, and grains Exercise, such as walking, is a good way to keep your bowels regular Drink warm fluids, especially warm prune juice, or decaf coffee Eat a 1/2 cup of real oatmeal (not instant), 1/2 cup applesauce, and 1/2-1 cup warm prune juice every day If needed, you may take Colace (docusate sodium) stool  softener once or twice a day to help keep the stool soft.  If you still are having problems with constipation, you may take Miralax once daily as needed to help keep your bowels regular.   Home Blood Pressure Monitoring for Patients   Your provider has recommended that you check your blood pressure (BP) at least once a week at home. If you do not have a blood pressure cuff at home, one will be provided for you. Contact your provider if you have not received your monitor within 1 week.   Helpful Tips for Accurate Home Blood Pressure Checks  Don't smoke, exercise, or drink caffeine 30 minutes before checking your BP Use the restroom before checking your BP (a full bladder can raise your pressure) Relax in a comfortable upright chair Feet on the ground Left arm resting comfortably on a flat surface at the level of your heart Legs uncrossed Back supported Sit quietly and don't talk Place the cuff on your bare arm Adjust snuggly, so that only two fingertips can fit between your skin and the top of the cuff Check 2 readings separated by at least one minute Keep a log of your BP readings For a visual, please reference this diagram: http://ccnc.care/bpdiagram  Provider Name: [facility]     Phone: [phone]  Zone 1: ALL CLEAR  Continue to monitor your symptoms:  BP reading is less than 140 (top number) or less than 90 (bottom  number)  No right upper stomach pain No headaches or seeing spots No feeling nauseated or throwing up No swelling in face and hands  Zone 2: CAUTION Call your doctor's office for any of the following:  BP reading is greater than 140 (top number) or greater than 90 (bottom number)  Stomach pain under your ribs in the middle or right side Headaches or seeing spots Feeling nauseated or throwing up Swelling in face and hands  Zone 3: EMERGENCY  Seek immediate medical care if you have any of the following:  BP reading is greater than160 (top number) or  greater than 110 (bottom number) Severe headaches not improving with Tylenol Serious difficulty catching your breath Any worsening symptoms from Zone 2    First Trimester of Pregnancy The first trimester of pregnancy is from week 1 until the end of week 12 (months 1 through 3). A week after a sperm fertilizes an egg, the egg will implant on the wall of the uterus. This embryo will begin to develop into a baby. Genes from you and your partner are forming the baby. The female genes determine whether the baby is a boy or a girl. At 6-8 weeks, the eyes and face are formed, and the heartbeat can be seen on ultrasound. At the end of 12 weeks, all the baby's organs are formed.  Now that you are pregnant, you will want to do everything you can to have a healthy baby. Two of the most important things are to get good prenatal care and to follow your health care provider's instructions. Prenatal care is all the medical care you receive before the baby's birth. This care will help prevent, find, and treat any problems during the pregnancy and childbirth. BODY CHANGES Your body goes through many changes during pregnancy. The changes vary from woman to woman.  You may gain or lose a couple of pounds at first. You may feel sick to your stomach (nauseous) and throw up (vomit). If the vomiting is uncontrollable, call your health care provider. You may tire easily. You may develop headaches that can be relieved by medicines approved by your health care provider. You may urinate more often. Painful urination may mean you have a bladder infection. You may develop heartburn as a result of your pregnancy. You may develop constipation because certain hormones are causing the muscles that push waste through your intestines to slow down. You may develop hemorrhoids or swollen, bulging veins (varicose veins). Your breasts may begin to grow larger and become tender. Your nipples may stick out more, and the tissue that  surrounds them (areola) may become darker. Your gums may bleed and may be sensitive to brushing and flossing. Dark spots or blotches (chloasma, mask of pregnancy) may develop on your face. This will likely fade after the baby is born. Your menstrual periods will stop. You may have a loss of appetite. You may develop cravings for certain kinds of food. You may have changes in your emotions from day to day, such as being excited to be pregnant or being concerned that something may go wrong with the pregnancy and baby. You may have more vivid and strange dreams. You may have changes in your hair. These can include thickening of your hair, rapid growth, and changes in texture. Some women also have hair loss during or after pregnancy, or hair that feels dry or thin. Your hair will most likely return to normal after your baby is born. WHAT TO EXPECT AT YOUR PRENATAL  VISITS During a routine prenatal visit: You will be weighed to make sure you and the baby are growing normally. Your blood pressure will be taken. Your abdomen will be measured to track your baby's growth. The fetal heartbeat will be listened to starting around week 10 or 12 of your pregnancy. Test results from any previous visits will be discussed. Your health care provider may ask you: How you are feeling. If you are feeling the baby move. If you have had any abnormal symptoms, such as leaking fluid, bleeding, severe headaches, or abdominal cramping. If you have any questions. Other tests that may be performed during your first trimester include: Blood tests to find your blood type and to check for the presence of any previous infections. They will also be used to check for low iron levels (anemia) and Rh antibodies. Later in the pregnancy, blood tests for diabetes will be done along with other tests if problems develop. Urine tests to check for infections, diabetes, or protein in the urine. An ultrasound to confirm the proper growth  and development of the baby. An amniocentesis to check for possible genetic problems. Fetal screens for spina bifida and Down syndrome. You may need other tests to make sure you and the baby are doing well. HOME CARE INSTRUCTIONS  Medicines Follow your health care provider's instructions regarding medicine use. Specific medicines may be either safe or unsafe to take during pregnancy. Take your prenatal vitamins as directed. If you develop constipation, try taking a stool softener if your health care provider approves. Diet Eat regular, well-balanced meals. Choose a variety of foods, such as meat or vegetable-based protein, fish, milk and low-fat dairy products, vegetables, fruits, and whole grain breads and cereals. Your health care provider will help you determine the amount of weight gain that is right for you. Avoid raw meat and uncooked cheese. These carry germs that can cause birth defects in the baby. Eating four or five small meals rather than three large meals a day may help relieve nausea and vomiting. If you start to feel nauseous, eating a few soda crackers can be helpful. Drinking liquids between meals instead of during meals also seems to help nausea and vomiting. If you develop constipation, eat more high-fiber foods, such as fresh vegetables or fruit and whole grains. Drink enough fluids to keep your urine clear or pale yellow. Activity and Exercise Exercise only as directed by your health care provider. Exercising will help you: Control your weight. Stay in shape. Be prepared for labor and delivery. Experiencing pain or cramping in the lower abdomen or low back is a good sign that you should stop exercising. Check with your health care provider before continuing normal exercises. Try to avoid standing for long periods of time. Move your legs often if you must stand in one place for a long time. Avoid heavy lifting. Wear low-heeled shoes, and practice good posture. You may  continue to have sex unless your health care provider directs you otherwise. Relief of Pain or Discomfort Wear a good support bra for breast tenderness.   Take warm sitz baths to soothe any pain or discomfort caused by hemorrhoids. Use hemorrhoid cream if your health care provider approves.   Rest with your legs elevated if you have leg cramps or low back pain. If you develop varicose veins in your legs, wear support hose. Elevate your feet for 15 minutes, 3-4 times a day. Limit salt in your diet. Prenatal Care Schedule your prenatal visits by the  twelfth week of pregnancy. They are usually scheduled monthly at first, then more often in the last 2 months before delivery. Write down your questions. Take them to your prenatal visits. Keep all your prenatal visits as directed by your health care provider. Safety Wear your seat belt at all times when driving. Make a list of emergency phone numbers, including numbers for family, friends, the hospital, and police and fire departments. General Tips Ask your health care provider for a referral to a local prenatal education class. Begin classes no later than at the beginning of month 6 of your pregnancy. Ask for help if you have counseling or nutritional needs during pregnancy. Your health care provider can offer advice or refer you to specialists for help with various needs. Do not use hot tubs, steam rooms, or saunas. Do not douche or use tampons or scented sanitary pads. Do not cross your legs for long periods of time. Avoid cat litter boxes and soil used by cats. These carry germs that can cause birth defects in the baby and possibly loss of the fetus by miscarriage or stillbirth. Avoid all smoking, herbs, alcohol, and medicines not prescribed by your health care provider. Chemicals in these affect the formation and growth of the baby. Schedule a dentist appointment. At home, brush your teeth with a soft toothbrush and be gentle when you floss. SEEK  MEDICAL CARE IF:  You have dizziness. You have mild pelvic cramps, pelvic pressure, or nagging pain in the abdominal area. You have persistent nausea, vomiting, or diarrhea. You have a bad smelling vaginal discharge. You have pain with urination. You notice increased swelling in your face, hands, legs, or ankles. SEEK IMMEDIATE MEDICAL CARE IF:  You have a fever. You are leaking fluid from your vagina. You have spotting or bleeding from your vagina. You have severe abdominal cramping or pain. You have rapid weight gain or loss. You vomit blood or material that looks like coffee grounds. You are exposed to [place] measles and have never had them. You are exposed to fifth disease or chickenpox. You develop a severe headache. You have shortness of breath. You have any kind of trauma, such as from a fall or a car accident. Document Released: 01/08/2001 Document Revised: 05/31/2013 Document Reviewed: 11/24/2012 [facility] Patient Information 2015 [place], [place]. This information is not intended to replace advice given to you by your health care provider. Make sure you discuss any questions you have with your health care provider.

## 2022-05-01 NOTE — Progress Notes (Signed)
INITIAL OBSTETRICAL VISIT Patient name: Angel Diaz MRN [id number]  Date of birth: 1995-11-08 Chief Complaint:   Initial Prenatal Visit  History of Present Illness:   Angel Diaz is a 26 y.o. G53P1001 Caucasian female at [redacted]w[redacted]d by [place] at 8.0 weeks with an Estimated Date of Delivery: 11/11/22 being seen today for her initial obstetrical visit.   Patient's last menstrual period was 01/29/2022. Her obstetrical history is significant for  SVD [id number] without complication.   Today she reports  feeling well; has had some spotting this preg with BRB on 3/24 with MAU visit, +BV  .  Last pap Oct 2019. Results were: NILM w/ HRHPV not done     05/01/2022    2:27 PM 01/27/2020    8:53 AM 10/07/2019   11:22 AM 11/10/2017    3:08 PM  Depression screen PHQ 2/9  Decreased Interest 0 1 2 0  Down, Depressed, Hopeless 1 0 1 0  PHQ - 2 Score 1 1 3  0  Altered sleeping 0 1 0   Tired, decreased energy 2 1 2    Change in appetite 1 0 1   Feeling bad or failure about yourself  1 0 0   Trouble concentrating 0 0 1   Moving slowly or fidgety/restless 0 0 0   Suicidal thoughts 0 0 0   PHQ-9 Score 5 3 7          05/01/2022    2:27 PM 01/27/2020    8:54 AM 10/07/2019   11:22 AM  GAD 7 : Generalized Anxiety Score  Nervous, Anxious, on Edge 1 0 1  Control/stop worrying 0 0 0  Worry too much - different things 1 0 0  Trouble relaxing 1 1 2   Restless 0 0 0  Easily annoyed or irritable 1 1 1   Afraid - awful might happen 0 0 0  Total GAD 7 Score 4 2 4      Review of Systems:   Pertinent items are noted in HPI Denies cramping/contractions, leakage of fluid, vaginal bleeding, abnormal vaginal discharge w/ itching/odor/irritation, headaches, visual changes, shortness of breath, chest pain, abdominal pain, severe nausea/vomiting, or problems with urination or bowel movements unless otherwise stated above.  Pertinent History Reviewed:  Reviewed past medical,surgical, social, obstetrical and family  history.  Reviewed problem list, medications and allergies. OB History  Gravida Para Term Preterm AB Living  2 1 1  0 0 1  SAB IAB Ectopic Multiple Live Births  0 0 0 0 1    # Outcome Date GA Lbr Len/2nd Weight Sex Delivery Anes PTL Lv  2 Current           1 Term 04/21/20 [redacted]w[redacted]d 08:57 / 02:45 8 lb 1.8 oz (3.68 kg) M Vag-Spont EPI  LIV     Birth Comments: None   Physical Assessment:   Vitals:   05/01/22 1422  BP: 115/73  Pulse: 92  Weight: 186 lb 12.8 oz (84.7 kg)  Body mass index is 31.09 kg/m.       Physical Examination:  General appearance - well appearing, and in no distress  Mental status - alert, oriented to person, place, and time  Psych:  She has a normal mood and affect  Skin - warm and dry, normal color, no suspicious lesions noted  Chest - effort normal, all lung fields clear to auscultation bilaterally  Heart - normal rate and regular rhythm  Abdomen - soft, nontender  Extremities:  No swelling or varicosities noted  Pelvic - VULVA: normal appearing vulva with no masses, tenderness or lesions  VAGINA: normal appearing vagina with normal color and discharge, no lesions  CERVIX: normal appearing cervix without discharge or lesions, no CMT; ectropic appearance around os, friable  Thin prep pap is done with HR HPV cotesting  Chaperone: Peggy Dones    TODAY'S NT (unable to obtain) [place] 12+2 wks,measurements c/w dates,CRL65.85 mm, posterior placenta,NB present,normal ovaries,FHR 162 bpm,unable to obtain NT because of fetal positio   No results found for this or any previous visit (from the past 24 hour(s)).  Assessment & Plan:  1) Low-Risk Pregnancy G2P1001 at [redacted]w[redacted]d with an Estimated Date of Delivery: 11/11/22   2) Initial OB visit  3) Friable cx  Meds: No orders of the defined types were placed in this encounter.   Initial labs obtained Continue prenatal vitamins Reviewed n/v relief measures and warning s/s to report Reviewed recommended weight gain based on  pre-gravid BMI Encouraged well-balanced diet Genetic & carrier screening discussed: requests Panorama and AFP, declines NT/IT (unable to obtain today) Ultrasound discussed; fetal survey: requested Yaak completed> form faxed if has or is planning to apply for medicaid The nature of [place] [facility] [employer] with multiple MDs and other Advanced Practice Providers was explained to patient; also emphasized that fellows, residents, and students are part of our team. Does have home bp cuff. Office bp cuff given: no. Rx sent: n/a. Check bp weekly, let us know if consistently >140/90.   No indications for ASA therapy or early A1c (per uptodate)  Follow-up: Return for 4wk LROB; 7-8wk LROB/anatomy u/s.   Orders Placed This Encounter  Procedures   Urine Culture   US OB Comp + 14 Wk   CBC/D/Plt+RPR+Rh+ABO+RubIgG...   Panorama Prenatal Test Full Panel    Myrtis [facility] 05/01/2022 2:40 PM

## 2022-05-02 ENCOUNTER — Other Ambulatory Visit: Payer: Self-pay | Admitting: Obstetrics & Gynecology

## 2022-05-02 LAB — CBC/D/PLT+RPR+RH+ABO+RUBIGG...
Antibody Screen: NEGATIVE
Basophils Absolute: 0.1 10*3/uL (ref 0.0–0.2)
Basos: 1 %
EOS (ABSOLUTE): 0.1 10*3/uL (ref 0.0–0.4)
Eos: 1 %
HCV Ab: NONREACTIVE
HIV Screen 4th Generation wRfx: NONREACTIVE
Hematocrit: 42.6 % (ref 34.0–46.6)
Hemoglobin: 14.1 g/dL (ref 11.1–15.9)
Hepatitis B Surface Ag: NEGATIVE
Immature Grans (Abs): 0.1 10*3/uL (ref 0.0–0.1)
Immature Granulocytes: 1 %
Lymphocytes Absolute: 2.6 10*3/uL (ref 0.7–3.1)
Lymphs: 24 %
MCH: 29.2 pg (ref 26.6–33.0)
MCHC: 33.1 g/dL (ref 31.5–35.7)
MCV: 88 fL (ref 79–97)
Monocytes Absolute: 0.9 10*3/uL (ref 0.1–0.9)
Monocytes: 8 %
Neutrophils Absolute: 7.3 10*3/uL — ABNORMAL HIGH (ref 1.4–7.0)
Neutrophils: 65 %
Platelets: 405 10*3/uL (ref 150–450)
RBC: 4.83 x10E6/uL (ref 3.77–5.28)
RDW: 12.9 % (ref 11.7–15.4)
RPR Ser Ql: NONREACTIVE
Rh Factor: POSITIVE
Rubella Antibodies, IGG: 4.67 index (ref >0.99)
WBC: 11.1 10*3/uL — ABNORMAL HIGH (ref 3.4–10.8)

## 2022-05-02 LAB — HCV INTERPRETATION

## 2022-05-04 DIAGNOSIS — N3001 Acute cystitis with hematuria: Secondary | ICD-10-CM | POA: Diagnosis not present

## 2022-05-04 DIAGNOSIS — Z681 Body mass index (BMI) 19 or less, adult: Secondary | ICD-10-CM | POA: Diagnosis not present

## 2022-05-04 LAB — URINE CULTURE

## 2022-05-05 ENCOUNTER — Other Ambulatory Visit: Payer: Self-pay | Admitting: Obstetrics & Gynecology

## 2022-05-05 ENCOUNTER — Encounter: Payer: Self-pay | Admitting: Women's Health

## 2022-05-05 DIAGNOSIS — N3000 Acute cystitis without hematuria: Secondary | ICD-10-CM

## 2022-05-05 DIAGNOSIS — O2342 Unspecified infection of urinary tract in pregnancy, second trimester: Secondary | ICD-10-CM | POA: Insufficient documentation

## 2022-05-05 MED ORDER — CEPHALEXIN 500 MG PO CAPS: 500.0000 mg | ORAL_CAPSULE | Freq: Two times a day (BID) | ORAL | 0 refills | Status: AC

## 2022-05-05 NOTE — Progress Notes (Signed)
Rx sent in for recent UTI

## 2022-05-06 LAB — CYTOLOGY - PAP
Chlamydia: NEGATIVE
Comment: NEGATIVE
Comment: NEGATIVE
Comment: NEGATIVE
Comment: NORMAL
Diagnosis: NEGATIVE
High risk HPV: NEGATIVE
Neisseria Gonorrhea: NEGATIVE
Trichomonas: NEGATIVE

## 2022-05-09 LAB — PANORAMA PRENATAL TEST FULL PANEL:PANORAMA TEST PLUS 5 ADDITIONAL MICRODELETIONS: FETAL FRACTION: 8.3

## 2022-05-18 DIAGNOSIS — R399 Unspecified symptoms and signs involving the genitourinary system: Secondary | ICD-10-CM | POA: Diagnosis not present

## 2022-05-18 DIAGNOSIS — Z3492 Encounter for supervision of normal pregnancy, unspecified, second trimester: Secondary | ICD-10-CM | POA: Diagnosis not present

## 2022-05-18 DIAGNOSIS — R350 Frequency of micturition: Secondary | ICD-10-CM | POA: Diagnosis not present

## 2022-05-29 ENCOUNTER — Ambulatory Visit (INDEPENDENT_AMBULATORY_CARE_PROVIDER_SITE_OTHER): Payer: BC Managed Care – PPO | Admitting: Advanced Practice Midwife

## 2022-05-29 ENCOUNTER — Encounter: Payer: Self-pay | Admitting: Advanced Practice Midwife

## 2022-05-29 VITALS — BP 114/72 | HR 88 | Wt 190.0 lb

## 2022-05-29 DIAGNOSIS — Z3A16 16 weeks gestation of pregnancy: Secondary | ICD-10-CM

## 2022-05-29 DIAGNOSIS — Z1389 Encounter for screening for other disorder: Secondary | ICD-10-CM

## 2022-05-29 DIAGNOSIS — Z331 Pregnant state, incidental: Secondary | ICD-10-CM

## 2022-05-29 DIAGNOSIS — Z1379 Encounter for other screening for genetic and chromosomal anomalies: Secondary | ICD-10-CM

## 2022-05-29 DIAGNOSIS — Z09 Encounter for follow-up examination after completed treatment for conditions other than malignant neoplasm: Secondary | ICD-10-CM

## 2022-05-29 DIAGNOSIS — Z3480 Encounter for supervision of other normal pregnancy, unspecified trimester: Secondary | ICD-10-CM

## 2022-05-29 LAB — POCT URINALYSIS DIPSTICK OB
Glucose, UA: NEGATIVE
Ketones, UA: NEGATIVE
Leukocytes, UA: NEGATIVE
Nitrite, UA: NEGATIVE
POC,PROTEIN,UA: NEGATIVE

## 2022-05-29 NOTE — Progress Notes (Signed)
   LOW-RISK PREGNANCY VISIT Patient name: Angel Diaz MRN [id number]  Date of birth: 03/31/1995 Chief Complaint:   Routine Prenatal Visit (AFP/ urine culture)  History of Present Illness:   Angel Diaz is a 27 y.o. G2P1001 female at [redacted]w[redacted]d with an Estimated Date of Delivery: 11/11/22 being seen today for ongoing management of a low-risk pregnancy.  Today she reports  having some heartburn, also RUQ pain at times- had ths pre-preg with neg GB u/s but ^ bilirubin; some nausea still . Contractions: Not present.  .  Movement: Absent. denies leaking of fluid. Review of Systems:   Pertinent items are noted in HPI Denies abnormal vaginal discharge w/ itching/odor/irritation, headaches, visual changes, shortness of breath, chest pain, abdominal pain, severe nausea/vomiting, or problems with urination or bowel movements unless otherwise stated above. Pertinent History Reviewed:  Reviewed past medical,surgical, social, obstetrical and family history.  Reviewed problem list, medications and allergies. Physical Assessment:   Vitals:   05/29/22 0930  BP: 114/72  Pulse: 88  Weight: 190 lb (86.2 kg)  Body mass index is 31.62 kg/m.        Physical Examination:   General appearance: Well appearing, and in no distress  Mental status: Alert, oriented to person, place, and time  Skin: Warm & dry  Cardiovascular: Normal heart rate noted  Respiratory: Normal respiratory effort, no distress  Abdomen: Soft, gravid, nontender  Pelvic: Cervical exam deferred         Extremities: Edema: None  Fetal Status: Fetal Heart Rate (bpm): 155   Movement: Absent    Results for orders placed or performed in visit on 05/29/22 (from the past 24 hour(s))  POC Urinalysis Dipstick OB   Collection Time: 05/29/22  9:58 AM  Result Value Ref Range   Color, UA     Clarity, UA     Glucose, UA Negative Negative   Bilirubin, UA     Ketones, UA negative    Spec Grav, UA     Blood, UA trace    pH, UA      POC,PROTEIN,UA Negative Negative, Trace, Small (1+), Moderate (2+), Large (3+), 4+   Urobilinogen, UA     Nitrite, UA negative    Leukocytes, UA Negative Negative   Appearance     Odor      Assessment & Plan:  1) Low-risk pregnancy G2P1001 at [redacted]w[redacted]d with an Estimated Date of Delivery: 11/11/22   2) Prev UTI, urine TOC today  3) RUQ pain, had neg GB u/s in Dec; intermittent discomfort; recommended bland diet; if persists we can get abd u/s   Meds: No orders of the defined types were placed in this encounter.  Labs/procedures today: AFP; urine TOC  Plan:  Continue routine obstetrical care   Reviewed: Preterm labor symptoms and general obstetric precautions including but not limited to vaginal bleeding, contractions, leaking of fluid and fetal movement were reviewed in detail with the patient.  All questions were answered. Has home bp cuff. Check bp weekly, let us know if >140/90.   Follow-up: Return for As scheduled. (Anatomy u/s)  Orders Placed This Encounter  Procedures   Urine Culture   AFP, Serum, Open Spina Bifida   POC Urinalysis Dipstick OB   [name] 05/29/2022 10:16 AM

## 2022-05-29 NOTE — Patient Instructions (Signed)
Angel Diaz, thank you for choosing our office today! We appreciate the opportunity to meet your healthcare needs. You may receive a short survey by mail, e-mail, or through [employer]. If you are happy with your care we would appreciate if you could take just a few minutes to complete the survey questions. We read all of your comments and take your feedback very seriously. Thank you again for choosing our office.  Center for [employer] Team at [facility] at [facility] ([address] [place], [place] [address]) Entrance C, located off of [address] Free 24/7 valet parking  Go to [email or web address] to register for FREE online childbirth classes  Call the office [phone]) or go to [facility] if: You begin to severe cramping Your water breaks.  Sometimes it is a big gush of fluid, sometimes it is just a trickle that keeps getting your panties wet or running down your legs You have vaginal bleeding.  It is normal to have a small amount of spotting if your cervix was checked.   [facility] Pediatricians/Family Doctors [facility]): [address] Colette Ribas, [phone]           [facility]: [address], [phone]                [facility] Medicine [facility]): [address], [phone] (call to ask if accepting patients) [facility] Department: [address], [place], [phone]    [facility] Pediatricians/Family Doctors [facility]): [phone], [phone] [facility]: [address] [place], [phone] [facility]: [address], [phone]  [facility] Doctors  [facility]): [phone] [facility]: [address], [phone]   [facility] Doctors [facility]: [address], [phone]  [facility] Doctors  [facility]: [phone], [phone]  Home Blood Pressure Monitoring for Patients   Your provider has recommended that you check your blood pressure (BP) at least once a week at home. If you do not have a blood pressure cuff at home, one will be provided for you. Contact your provider if you have not received your monitor within 1 week.   Helpful Tips for Accurate Home Blood Pressure Checks  Don't smoke, exercise, or drink caffeine 30 minutes before checking your BP Use the restroom before checking your BP (a full bladder can raise your pressure) Relax in a comfortable upright chair Feet on the ground Left arm resting comfortably on a flat surface at the level of your heart Legs uncrossed Back supported Sit quietly and don't talk Place the cuff on your bare arm Adjust snuggly, so that only two fingertips can fit between your skin and the top of the cuff Check 2 readings separated by at least one minute Keep a log of your BP readings For a visual, please reference this diagram: http://ccnc.care/bpdiagram  Provider Name: [facility]     Phone: [phone]  Zone 1: ALL CLEAR  Continue to monitor your symptoms:  BP reading is less than 140 (top number) or less than 90 (bottom number)  No right upper stomach pain No headaches or seeing spots No feeling nauseated or throwing up No swelling in face and hands  Zone 2: CAUTION Call your doctor's office for any of the following:  BP reading is greater than 140 (top number) or greater than  90 (bottom number)  Stomach pain under your ribs in the middle or right side Headaches or seeing spots Feeling nauseated or throwing up Swelling in face and hands  Zone 3: EMERGENCY  Seek immediate medical care if you have any of the following:  BP reading is greater than160 (top number) or greater than 110 (bottom number) Severe headaches not improving with Tylenol Serious difficulty catching your breath Any worsening symptoms from  Zone 2     Second Trimester of Pregnancy The second trimester is from week 14 through week 27 (months 4 through 6). The second trimester is often a time when you feel your best. Your body has adjusted to being pregnant, and you begin to feel better physically. Usually, morning sickness has lessened or quit completely, you may have more energy, and you may have an increase in appetite. The second trimester is also a time when the fetus is growing rapidly. At the end of the sixth month, the fetus is about 9 inches long and weighs about 1 pounds. You will likely begin to feel the baby move (quickening) between 16 and 20 weeks of pregnancy. Body changes during your second trimester Your body continues to go through many changes during your second trimester. The changes vary from woman to woman. Your weight will continue to increase. You will notice your lower abdomen bulging out. You may begin to get stretch marks on your hips, abdomen, and breasts. You may develop headaches that can be relieved by medicines. The medicines should be approved by your health care provider. You may urinate more often because the fetus is pressing on your bladder. You may develop or continue to have heartburn as a result of your pregnancy. You may develop constipation because certain hormones are causing the muscles that push waste through your intestines to slow down. You may develop hemorrhoids or swollen, bulging veins (varicose veins). You may have back pain. This is caused by: Weight gain. Pregnancy hormones that are relaxing the joints in your pelvis. A shift in weight and the muscles that support your balance. Your breasts will continue to grow and they will continue to become tender. Your gums may bleed and may be sensitive to brushing and flossing. Dark spots or blotches (chloasma, mask of pregnancy) may develop on your face. This will likely fade after the baby is born. A dark line from your belly button to  the pubic area (linea nigra) may appear. This will likely fade after the baby is born. You may have changes in your hair. These can include thickening of your hair, rapid growth, and changes in texture. Some women also have hair loss during or after pregnancy, or hair that feels dry or thin. Your hair will most likely return to normal after your baby is born.  What to expect at prenatal visits During a routine prenatal visit: You will be weighed to make sure you and the fetus are growing normally. Your blood pressure will be taken. Your abdomen will be measured to track your baby's growth. The fetal heartbeat will be listened to. Any test results from the previous visit will be discussed.  Your health care provider may ask you: How you are feeling. If you are feeling the baby move. If you have had any abnormal symptoms, such as leaking fluid, bleeding, severe headaches, or abdominal cramping. If you are using any tobacco products, including cigarettes, chewing tobacco, and electronic cigarettes. If you have any questions.  Other tests that may be performed during   your second trimester include: Blood tests that check for: Low iron levels (anemia). High blood sugar that affects pregnant women (gestational diabetes) between 24 and 28 weeks. Rh antibodies. This is to check for a protein on red blood cells (Rh factor). Urine tests to check for infections, diabetes, or protein in the urine. An ultrasound to confirm the proper growth and development of the baby. An amniocentesis to check for possible genetic problems. Fetal screens for spina bifida and Down syndrome. HIV (human immunodeficiency virus) testing. Routine prenatal testing includes screening for HIV, unless you choose not to have this test.  Follow these instructions at home: Medicines Follow your health care provider's instructions regarding medicine use. Specific medicines may be either safe or unsafe to take during  pregnancy. Take a prenatal vitamin that contains at least 600 micrograms (mcg) of folic acid. If you develop constipation, try taking a stool softener if your health care provider approves. Eating and drinking Eat a balanced diet that includes fresh fruits and vegetables, whole grains, good sources of protein such as meat, eggs, or tofu, and low-fat dairy. Your health care provider will help you determine the amount of weight gain that is right for you. Avoid raw meat and uncooked cheese. These carry germs that can cause birth defects in the baby. If you have low calcium intake from food, talk to your health care provider about whether you should take a daily calcium supplement. Limit foods that are high in fat and processed sugars, such as fried and sweet foods. To prevent constipation: Drink enough fluid to keep your urine clear or pale yellow. Eat foods that are high in fiber, such as fresh fruits and vegetables, whole grains, and beans. Activity Exercise only as directed by your health care provider. Most women can continue their usual exercise routine during pregnancy. Try to exercise for 30 minutes at least 5 days a week. Stop exercising if you experience uterine contractions. Avoid heavy lifting, wear low heel shoes, and practice good posture. A sexual relationship may be continued unless your health care provider directs you otherwise. Relieving pain and discomfort Wear a good support bra to prevent discomfort from breast tenderness. Take warm sitz baths to soothe any pain or discomfort caused by hemorrhoids. Use hemorrhoid cream if your health care provider approves. Rest with your legs elevated if you have leg cramps or low back pain. If you develop varicose veins, wear support hose. Elevate your feet for 15 minutes, 3-4 times a day. Limit salt in your diet. Prenatal Care Write down your questions. Take them to your prenatal visits. Keep all your prenatal visits as told by your health  care provider. This is important. Safety Wear your seat belt at all times when driving. Make a list of emergency phone numbers, including numbers for family, friends, the hospital, and police and fire departments. General instructions Ask your health care provider for a referral to a local prenatal education class. Begin classes no later than the beginning of month 6 of your pregnancy. Ask for help if you have counseling or nutritional needs during pregnancy. Your health care provider can offer advice or refer you to specialists for help with various needs. Do not use hot tubs, steam rooms, or saunas. Do not douche or use tampons or scented sanitary pads. Do not cross your legs for long periods of time. Avoid cat litter boxes and soil used by cats. These carry germs that can cause birth defects in the baby and possibly loss of the   fetus by miscarriage or stillbirth. Avoid all smoking, herbs, alcohol, and unprescribed drugs. Chemicals in these products can affect the formation and growth of the baby. Do not use any products that contain nicotine or tobacco, such as cigarettes and e-cigarettes. If you need help quitting, ask your health care provider. Visit your dentist if you have not gone yet during your pregnancy. Use a soft toothbrush to brush your teeth and be gentle when you floss. Contact a health care provider if: You have dizziness. You have mild pelvic cramps, pelvic pressure, or nagging pain in the abdominal area. You have persistent nausea, vomiting, or diarrhea. You have a bad smelling vaginal discharge. You have pain when you urinate. Get help right away if: You have a fever. You are leaking fluid from your vagina. You have spotting or bleeding from your vagina. You have severe abdominal cramping or pain. You have rapid weight gain or weight loss. You have shortness of breath with chest pain. You notice sudden or extreme swelling of your face, hands, ankles, feet, or legs. You  have not felt your baby move in over an hour. You have severe headaches that do not go away when you take medicine. You have vision changes. Summary The second trimester is from week 14 through week 27 (months 4 through 6). It is also a time when the fetus is growing rapidly. Your body goes through many changes during pregnancy. The changes vary from woman to woman. Avoid all smoking, herbs, alcohol, and unprescribed drugs. These chemicals affect the formation and growth your baby. Do not use any tobacco products, such as cigarettes, chewing tobacco, and e-cigarettes. If you need help quitting, ask your health care provider. Contact your health care provider if you have any questions. Keep all prenatal visits as told by your health care provider. This is important. This information is not intended to replace advice given to you by your health care provider. Make sure you discuss any questions you have with your health care provider. Document Released: 01/08/2001 Document Revised: 06/22/2015 Document Reviewed: 03/17/2012 Elsevier Interactive Patient Education  2017 [employer] Inc.  

## 2022-05-31 LAB — URINE CULTURE

## 2022-06-07 ENCOUNTER — Telehealth: Payer: Self-pay | Admitting: *Deleted

## 2022-06-07 LAB — AFP, SERUM, OPEN SPINA BIFIDA
AFP MoM: 0.61
AFP Value: 17.7 ng/mL
Gest. Age on Collection Date: 16 weeks
Maternal Age At EDD: 28.2 yr
OSBR Risk 1 IN: 10000
Test Results:: NEGATIVE
Weight: 190 [lb_av]

## 2022-06-07 NOTE — Telephone Encounter (Signed)
Called [facility] to provide weight to missing info on AFP.

## 2022-06-18 IMAGING — US US ABDOMEN COMPLETE
1 series · 14 of 25 positions shown · non-contrast
Comparison: None.

CLINICAL DATA: Right upper quadrant pain

EXAM:
ABDOMEN ULTRASOUND COMPLETE

[Series 1: us abdomen complete · 14 of 148 slices shown]
[im 1/148]
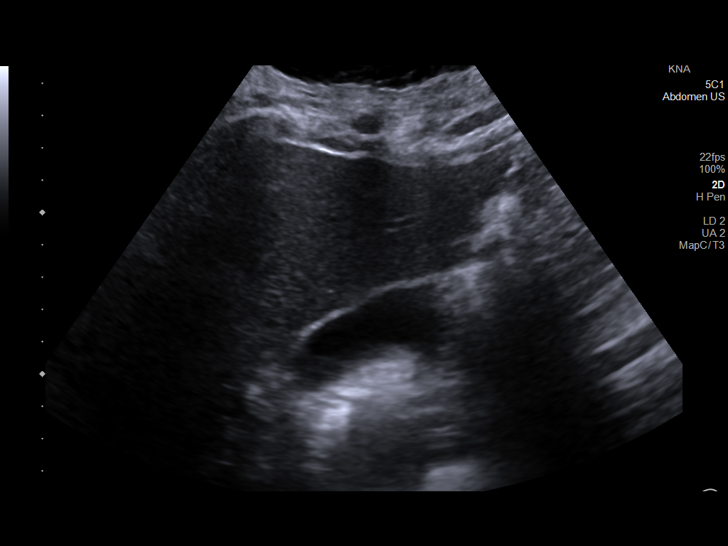
[im 13/148]
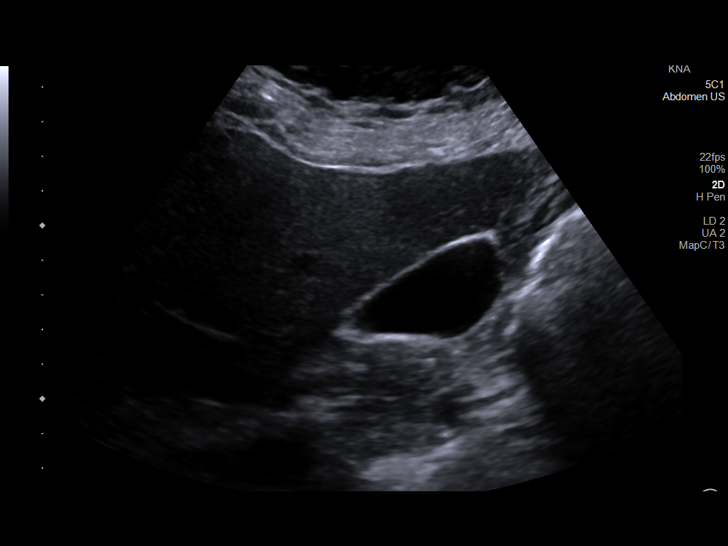
[im 25/148]
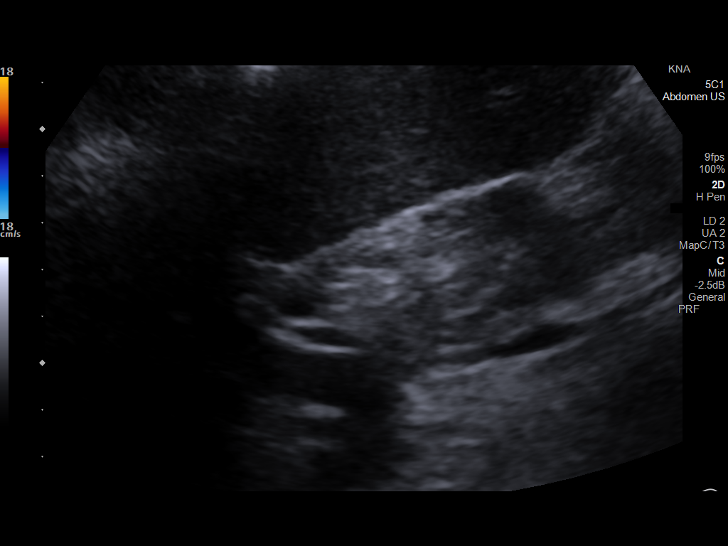
[im 37/148]
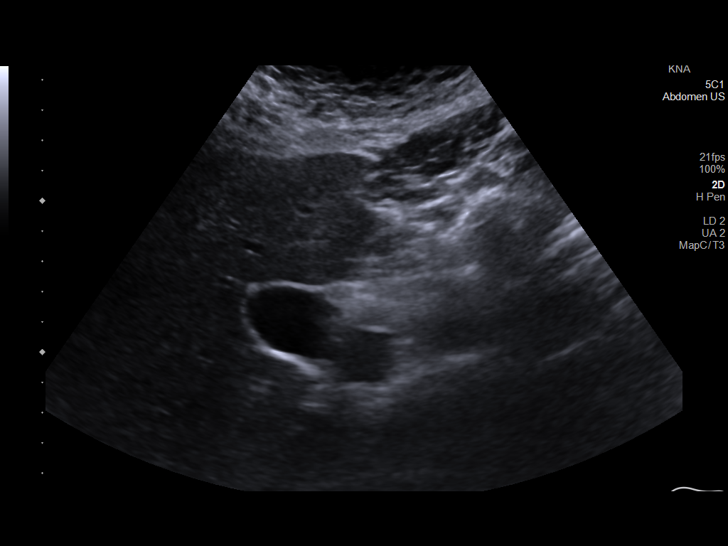
[im 50/148]
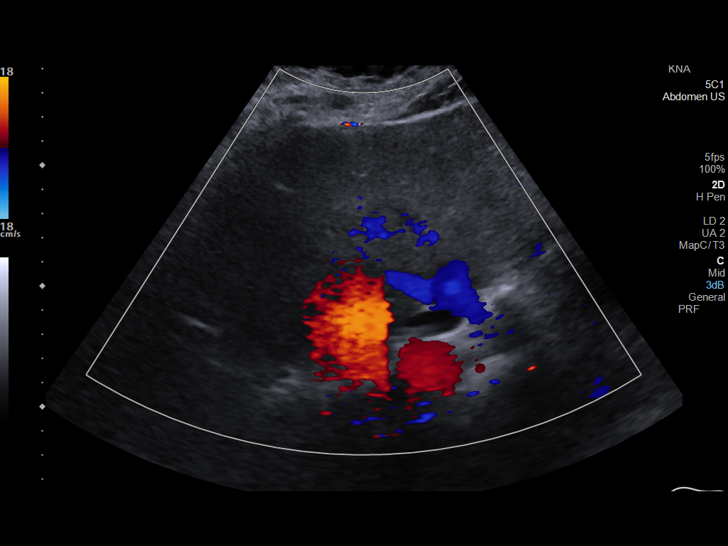
[im 56/148]
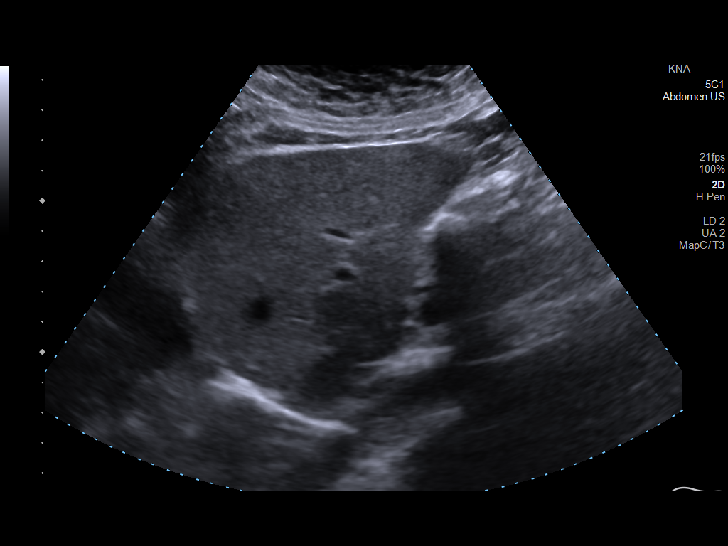
[im 68/148]
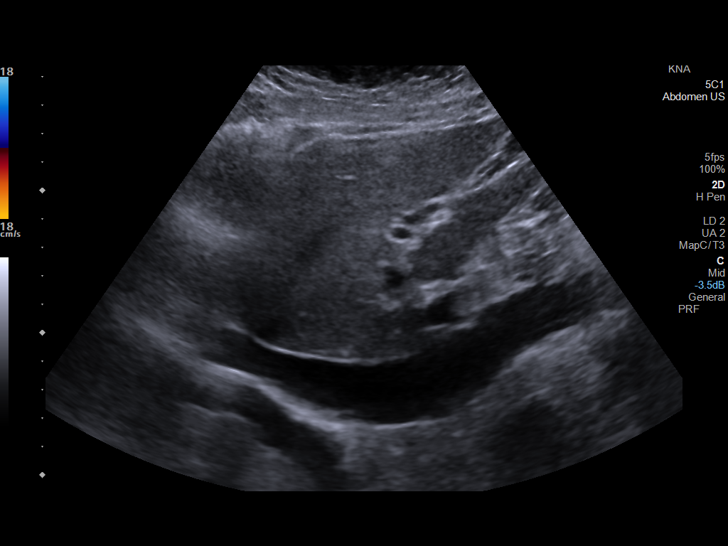
[im 80/148]
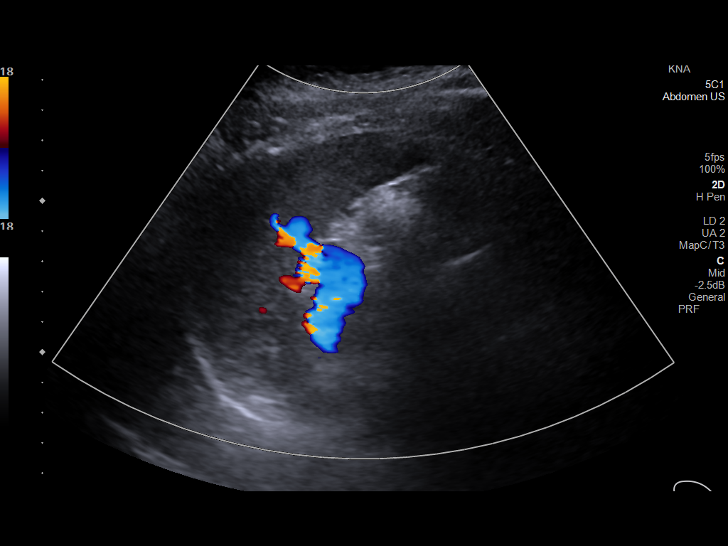
[im 92/148]
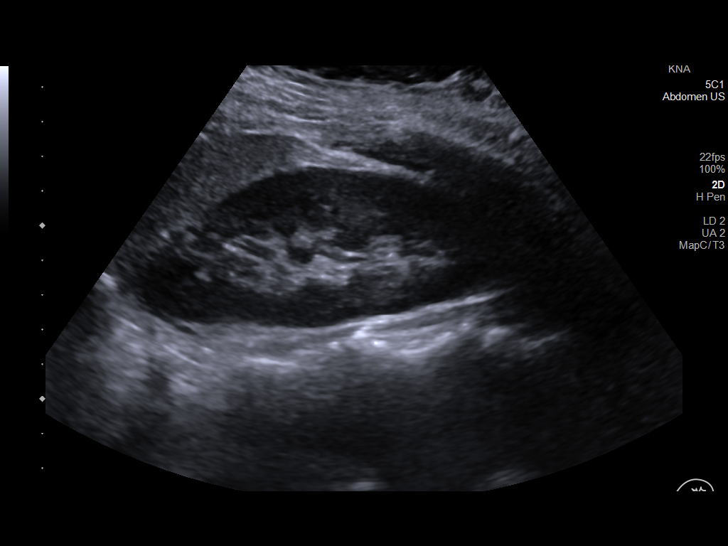
[im 99/148]
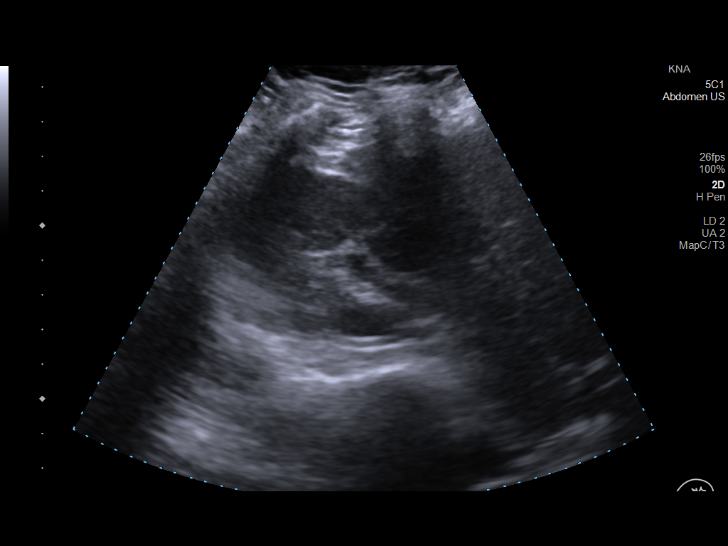
[im 111/148]
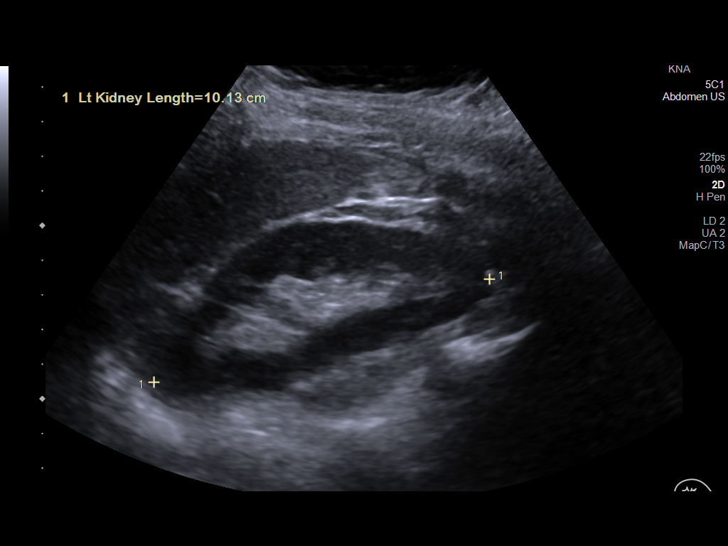
[im 123/148]
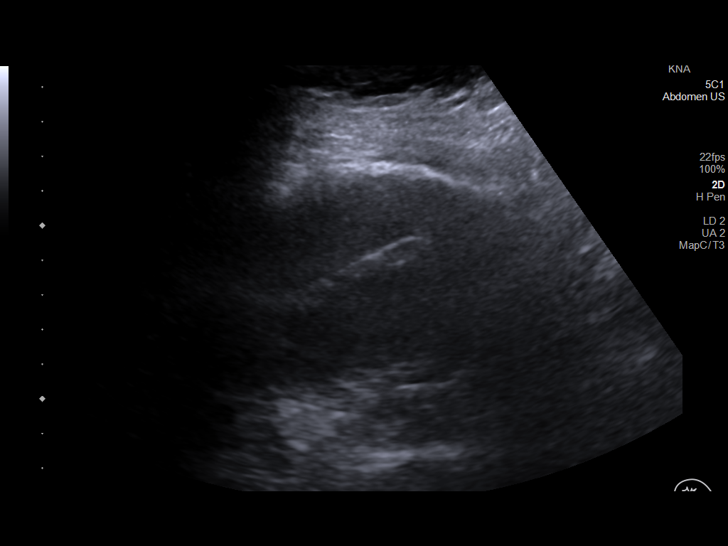
[im 135/148]
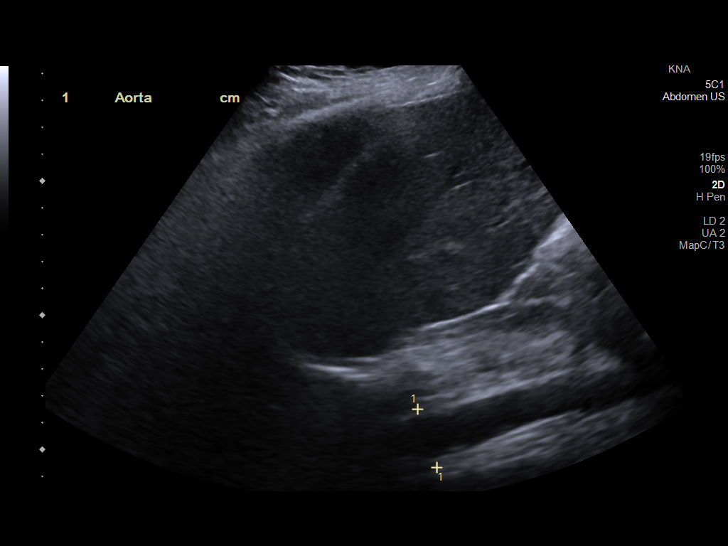
[im 148/148]
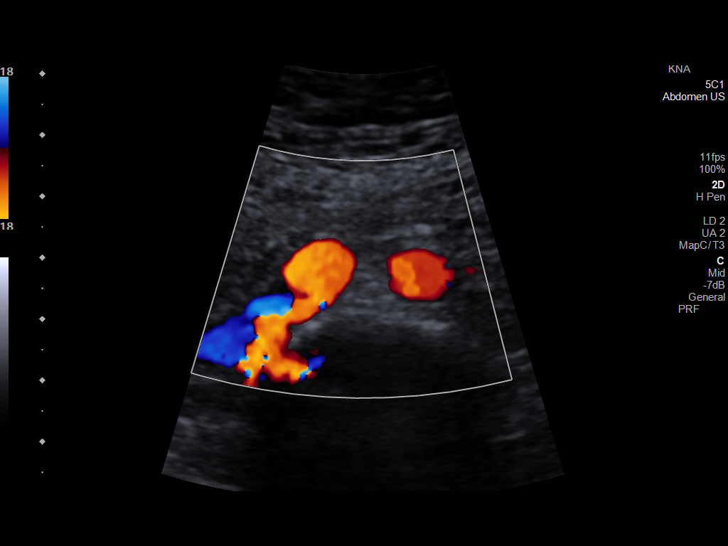

[14 of 25 positions shown; findings below may reference images not displayed]

FINDINGS: Gallbladder: No gallstones or wall thickening visualized. No
sonographic Murphy sign noted by sonographer.

Common bile duct: Diameter: 4 mm

Liver: Slightly echogenic. No focal hepatic abnormality. Portal vein
is patent on color Doppler imaging with normal direction of blood
flow towards the liver.

IVC: No abnormality visualized.

Pancreas: Visualized portion unremarkable.

Spleen: Size and appearance within normal limits.

Right Kidney: Length: 11.6 cm. Echogenicity within normal limits. No
mass or hydronephrosis visualized.

Left Kidney: Length: 10.1 cm. Echogenicity within normal limits. No
mass or hydronephrosis visualized.

Abdominal aorta: No aneurysm visualized.

Other findings: None.
IMPRESSION: 1. Slightly increased hepatic echogenicity as may be seen with
steatosis.
2. Negative for gallstones.

## 2022-06-20 ENCOUNTER — Encounter: Payer: Self-pay | Admitting: Advanced Practice Midwife

## 2022-06-20 ENCOUNTER — Ambulatory Visit (INDEPENDENT_AMBULATORY_CARE_PROVIDER_SITE_OTHER): Payer: BC Managed Care – PPO | Admitting: Advanced Practice Midwife

## 2022-06-20 ENCOUNTER — Ambulatory Visit (INDEPENDENT_AMBULATORY_CARE_PROVIDER_SITE_OTHER): Payer: BC Managed Care – PPO

## 2022-06-20 VITALS — BP 113/75 | HR 78 | Wt 194.0 lb

## 2022-06-20 DIAGNOSIS — Z3A19 19 weeks gestation of pregnancy: Secondary | ICD-10-CM

## 2022-06-20 DIAGNOSIS — Z363 Encounter for antenatal screening for malformations: Secondary | ICD-10-CM

## 2022-06-20 DIAGNOSIS — Z3480 Encounter for supervision of other normal pregnancy, unspecified trimester: Secondary | ICD-10-CM

## 2022-06-20 NOTE — Progress Notes (Signed)
[place] 19+3 wks,breech,posterior placenta gr 0,normal ovaries,RVEICF,SVP of 6.7 cm,FHR 137 bpm,CX 3.9 cm,EFW 319 g 71%,anatomy complete

## 2022-06-20 NOTE — Patient Instructions (Signed)
Angel Diaz, I greatly value your feedback.  If you receive a survey following your visit with us today, we appreciate you taking the time to fill it out.  Thanks, Fran Cresenzo-Dishmon, CNM     [facility] It is now [facility] ([address] [place],  [address]) Entrance located off of [address] Free 24/7 valet parking   Go to [email or web address] to register for FREE online childbirth classes    Second Trimester of Pregnancy The second trimester is from week 14 through week 27 (months 4 through 6). The second trimester is often a time when you feel your best. Your body has adjusted to being pregnant, and you begin to feel better physically. Usually, morning sickness has lessened or quit completely, you may have more energy, and you may have an increase in appetite. The second trimester is also a time when the fetus is growing rapidly. At the end of the sixth month, the fetus is about 9 inches long and weighs about 1 pounds. You will likely begin to feel the baby move (quickening) between 16 and 20 weeks of pregnancy. Body changes during your second trimester Your body continues to go through many changes during your second trimester. The changes vary from woman to woman.  Your weight will continue to increase. You will notice your lower abdomen bulging out.  You may begin to get stretch marks on your hips, abdomen, and breasts.  You may develop headaches that can be relieved by medicines. The medicines should be approved by your health care provider.  You may urinate more often because the fetus is pressing on your bladder.  You may develop or continue to have heartburn as a result of your pregnancy.  You may develop constipation because certain hormones are causing the muscles that push waste through your intestines to slow down.  You may develop hemorrhoids or swollen, bulging veins (varicose veins).  You may  have back pain. This is caused by: ? Weight gain. ? Pregnancy hormones that are relaxing the joints in your pelvis. ? A shift in weight and the muscles that support your balance.  Your breasts will continue to grow and they will continue to become tender.  Your gums may bleed and may be sensitive to brushing and flossing.  Dark spots or blotches (chloasma, mask of pregnancy) may develop on your face. This will likely fade after the baby is born.  A dark line from your belly button to the pubic area (linea nigra) may appear. This will likely fade after the baby is born.  You may have changes in your hair. These can include thickening of your hair, rapid growth, and changes in texture. Some women also have hair loss during or after pregnancy, or hair that feels dry or thin. Your hair will most likely return to normal after your baby is born.  What to expect at prenatal visits During a routine prenatal visit:  You will be weighed to make sure you and the fetus are growing normally.  Your blood pressure will be taken.  Your abdomen will be measured to track your baby's growth.  The fetal heartbeat will be listened to.  Any test results from the previous visit will be discussed.  Your health care provider may ask you:  How you are feeling.  If you are feeling the baby move.  If you have had any abnormal symptoms, such as leaking fluid, bleeding, severe headaches, or   abdominal cramping.  If you are using any tobacco products, including cigarettes, chewing tobacco, and electronic cigarettes.  If you have any questions.  Other tests that may be performed during your second trimester include:  Blood tests that check for: ? Low iron levels (anemia). ? High blood sugar that affects pregnant women (gestational diabetes) between 24 and 28 weeks. ? Rh antibodies. This is to check for a protein on red blood cells (Rh factor).  Urine tests to check for infections, diabetes, or protein  in the urine.  An ultrasound to confirm the proper growth and development of the baby.  An amniocentesis to check for possible genetic problems.  Fetal screens for spina bifida and Down syndrome.  HIV (human immunodeficiency virus) testing. Routine prenatal testing includes screening for HIV, unless you choose not to have this test.  Follow these instructions at home: Medicines  Follow your health care provider's instructions regarding medicine use. Specific medicines may be either safe or unsafe to take during pregnancy.  Take a prenatal vitamin that contains at least 600 micrograms (mcg) of folic acid.  If you develop constipation, try taking a stool softener if your health care provider approves. Eating and drinking  Eat a balanced diet that includes fresh fruits and vegetables, whole grains, good sources of protein such as meat, eggs, or tofu, and low-fat dairy. Your health care provider will help you determine the amount of weight gain that is right for you.  Avoid raw meat and uncooked cheese. These carry germs that can cause birth defects in the baby.  If you have low calcium intake from food, talk to your health care provider about whether you should take a daily calcium supplement.  Limit foods that are high in fat and processed sugars, such as fried and sweet foods.  To prevent constipation: ? Drink enough fluid to keep your urine clear or pale yellow. ? Eat foods that are high in fiber, such as fresh fruits and vegetables, whole grains, and beans. Activity  Exercise only as directed by your health care provider. Most women can continue their usual exercise routine during pregnancy. Try to exercise for 30 minutes at least 5 days a week. Stop exercising if you experience uterine contractions.  Avoid heavy lifting, wear low heel shoes, and practice good posture.  A sexual relationship may be continued unless your health care provider directs you otherwise. Relieving pain  and discomfort  Wear a good support bra to prevent discomfort from breast tenderness.  Take warm sitz baths to soothe any pain or discomfort caused by hemorrhoids. Use hemorrhoid cream if your health care provider approves.  Rest with your legs elevated if you have leg cramps or low back pain.  If you develop varicose veins, wear support hose. Elevate your feet for 15 minutes, 3-4 times a day. Limit salt in your diet. Prenatal Care  Write down your questions. Take them to your prenatal visits.  Keep all your prenatal visits as told by your health care provider. This is important. Safety  Wear your seat belt at all times when driving.  Make a list of emergency phone numbers, including numbers for family, friends, the hospital, and police and fire departments. General instructions  Ask your health care provider for a referral to a local prenatal education class. Begin classes no later than the beginning of month 6 of your pregnancy.  Ask for help if you have counseling or nutritional needs during pregnancy. Your health care provider can offer advice or   refer you to specialists for help with various needs.  Do not use hot tubs, steam rooms, or saunas.  Do not douche or use tampons or scented sanitary pads.  Do not cross your legs for long periods of time.  Avoid cat litter boxes and soil used by cats. These carry germs that can cause birth defects in the baby and possibly loss of the fetus by miscarriage or stillbirth.  Avoid all smoking, herbs, alcohol, and unprescribed drugs. Chemicals in these products can affect the formation and growth of the baby.  Do not use any products that contain nicotine or tobacco, such as cigarettes and e-cigarettes. If you need help quitting, ask your health care provider.  Visit your dentist if you have not gone yet during your pregnancy. Use a soft toothbrush to brush your teeth and be gentle when you floss. Contact a health care provider  if:  You have dizziness.  You have mild pelvic cramps, pelvic pressure, or nagging pain in the abdominal area.  You have persistent nausea, vomiting, or diarrhea.  You have a bad smelling vaginal discharge.  You have pain when you urinate. Get help right away if:  You have a fever.  You are leaking fluid from your vagina.  You have spotting or bleeding from your vagina.  You have severe abdominal cramping or pain.  You have rapid weight gain or weight loss.  You have shortness of breath with chest pain.  You notice sudden or extreme swelling of your face, hands, ankles, feet, or legs.  You have not felt your baby move in over an hour.  You have severe headaches that do not go away when you take medicine.  You have vision changes. Summary  The second trimester is from week 14 through week 27 (months 4 through 6). It is also a time when the fetus is growing rapidly.  Your body goes through many changes during pregnancy. The changes vary from woman to woman.  Avoid all smoking, herbs, alcohol, and unprescribed drugs. These chemicals affect the formation and growth your baby.  Do not use any tobacco products, such as cigarettes, chewing tobacco, and e-cigarettes. If you need help quitting, ask your health care provider.  Contact your health care provider if you have any questions. Keep all prenatal visits as told by your health care provider. This is important. This information is not intended to replace advice given to you by your health care provider. Make sure you discuss any questions you have with your health care provider.         

## 2022-06-20 NOTE — Progress Notes (Signed)
   LOW-RISK PREGNANCY VISIT Patient name: Angel Diaz MRN [id number]  Date of birth: 11-09-1995 Chief Complaint:   Routine Prenatal Visit and Pregnancy Ultrasound  History of Present Illness:   Angel Diaz is a 27 y.o. G25P1001 female at [redacted]w[redacted]d with an Estimated Date of Delivery: 11/11/22 being seen today for ongoing management of a low-risk pregnancy.  Today she reports no complaints. Contractions: Not present.  .  Movement: Present. denies leaking of fluid. Review of Systems:   Pertinent items are noted in HPI Denies abnormal vaginal discharge w/ itching/odor/irritation, headaches, visual changes, shortness of breath, chest pain, abdominal pain, severe nausea/vomiting, or problems with urination or bowel movements unless otherwise stated above. Pertinent History Reviewed:  Reviewed past medical,surgical, social, obstetrical and family history.  Reviewed problem list, medications and allergies. Physical Assessment:   Vitals:   06/20/22 1003  BP: 113/75  Pulse: 78  Weight: 194 lb (88 kg)  Body mass index is 32.28 kg/m.        Physical Examination:   General appearance: Well appearing, and in no distress  Mental status: Alert, oriented to person, place, and time  Skin: Warm & dry  Cardiovascular: Normal heart rate noted  Respiratory: Normal respiratory effort, no distress  Abdomen: Soft, gravid, nontender  Pelvic: Cervical exam deferred         Extremities: Edema: None  Fetal Status:     Movement: Present  [place] 19+3 wks,breech,posterior placenta gr 0,normal ovaries,RVEICF,SVP of 6.7 cm,FHR 137 bpm,CX 3.9 cm,EFW 319 g 71%,anatomy complete   Chaperone:  N/A    No results found for this or any previous visit (from the past 24 hour(s)).  Assessment & Plan:    Pregnancy: G2P1001 at [redacted]w[redacted]d 1. Supervision of other normal pregnancy, antepartum   2. [redacted] weeks gestation of pregnancy      Meds: No orders of the defined types were placed in this  encounter.  Labs/procedures today: anatomy scan  Plan:  Continue routine obstetrical care  Next visit: prefers in person    Reviewed:  general obstetric precautions including but not limited to vaginal bleeding, contractions, leaking of fluid and fetal movement were reviewed in detail with the patient.  All questions were answered. Has home bp cuff.. Check bp weekly, let us know if >140/90.   Follow-up: Return in about 4 weeks (around 07/18/2022) for LROB.  No future appointments.  No orders of the defined types were placed in this encounter.  Jacklyn Shell DNP, CNM 06/20/2022 10:17 AM

## 2022-07-18 ENCOUNTER — Encounter: Payer: Self-pay | Admitting: Advanced Practice Midwife

## 2022-07-18 ENCOUNTER — Ambulatory Visit (INDEPENDENT_AMBULATORY_CARE_PROVIDER_SITE_OTHER): Payer: BC Managed Care – PPO | Admitting: Obstetrics & Gynecology

## 2022-07-18 VITALS — BP 115/72 | HR 88 | Wt 203.4 lb

## 2022-07-18 DIAGNOSIS — Z3480 Encounter for supervision of other normal pregnancy, unspecified trimester: Secondary | ICD-10-CM

## 2022-07-18 DIAGNOSIS — Z3482 Encounter for supervision of other normal pregnancy, second trimester: Secondary | ICD-10-CM

## 2022-07-18 DIAGNOSIS — Z3A23 23 weeks gestation of pregnancy: Secondary | ICD-10-CM

## 2022-07-18 DIAGNOSIS — Z1389 Encounter for screening for other disorder: Secondary | ICD-10-CM | POA: Diagnosis not present

## 2022-07-18 DIAGNOSIS — Z331 Pregnant state, incidental: Secondary | ICD-10-CM | POA: Diagnosis not present

## 2022-07-18 LAB — POCT URINALYSIS DIPSTICK OB
Glucose, UA: NEGATIVE
Ketones, UA: NEGATIVE
Leukocytes, UA: NEGATIVE
Nitrite, UA: NEGATIVE
POC,PROTEIN,UA: NEGATIVE

## 2022-07-18 NOTE — Progress Notes (Signed)
LOW-RISK PREGNANCY VISIT Patient name: Angel Diaz MRN [id number]  Date of birth: February 25, 1995 Chief Complaint:   Routine Prenatal Visit (Pain and urine frequency)  History of Present Illness:   Angel Diaz is a 27 y.o. G28P1001 female at [redacted]w[redacted]d with an Estimated Date of Delivery: 11/11/22 being seen today for ongoing management of a low-risk pregnancy.      05/01/2022    2:27 PM 01/27/2020    8:53 AM 10/07/2019   11:22 AM 11/10/2017    3:08 PM  Depression screen PHQ 2/9  Decreased Interest 0 1 2 0  Down, Depressed, Hopeless 1 0 1 0  PHQ - 2 Score 1 1 3  0  Altered sleeping 0 1 0   Tired, decreased energy 2 1 2    Change in appetite 1 0 1   Feeling bad or failure about yourself  1 0 0   Trouble concentrating 0 0 1   Moving slowly or fidgety/restless 0 0 0   Suicidal thoughts 0 0 0   PHQ-9 Score 5 3 7      Notes concerns for UTI- will check UA and culture Contractions: Not present.  .  Movement: Present. denies leaking of fluid. Review of Systems:   Pertinent items are noted in HPI Denies abnormal vaginal discharge w/ itching/odor/irritation, headaches, visual changes, shortness of breath, chest pain, abdominal pain, severe nausea/vomiting, or bowel movements unless otherwise stated above. Pertinent History Reviewed:  Reviewed past medical,surgical, social, obstetrical and family history.  Reviewed problem list, medications and allergies.  Physical Assessment:   Vitals:   07/18/22 0827  BP: 115/72  Pulse: 88  Weight: 203 lb 6.4 oz (92.3 kg)  Body mass index is 33.85 kg/m.        Physical Examination:   General appearance: Well appearing, and in no distress  Mental status: Alert, oriented to person, place, and time  Skin: Warm & dry  Respiratory: Normal respiratory effort, no distress  Abdomen: Soft, gravid, nontender  Pelvic: Cervical exam deferred         Extremities: Edema: Trace  Psych:  mood and affect appropriate  Fetal Status: Fetal Heart Rate  (bpm): 145 Fundal Height: 22 cm Movement: Present    Chaperone: n/a    Results for orders placed or performed in visit on 07/18/22 (from the past 24 hour(s))  POC Urinalysis Dipstick OB   Collection Time: 07/18/22  8:43 AM  Result Value Ref Range   Color, UA     Clarity, UA     Glucose, UA Negative Negative   Bilirubin, UA     Ketones, UA negative    Spec Grav, UA     Blood, UA trace    pH, UA     POC,PROTEIN,UA Negative Negative, Trace, Small (1+), Moderate (2+), Large (3+), 4+   Urobilinogen, UA     Nitrite, UA negative    Leukocytes, UA Negative Negative   Appearance     Odor       Assessment & Plan:  1) Low-risk pregnancy G2P1001 at [redacted]w[redacted]d with an Estimated Date of Delivery: 11/11/22   -PN2 today -UA/culture pending   Routine OB care  Meds: No orders of the defined types were placed in this encounter.  Labs/procedures today: PN2  Plan:  Continue routine obstetrical care  Next visit: prefers in person    Reviewed: Preterm labor symptoms and general obstetric precautions including but not limited to vaginal bleeding, contractions, leaking of fluid and fetal movement were reviewed in detail  with the patient.  All questions were answered. Pt has home bp cuff. Check bp weekly, let us know if >140/90.   Follow-up: Return in about 3 weeks (around 08/08/2022) for LROB visit and PN-2.  Orders Placed This Encounter  Procedures   Urine Culture   Urinalysis   POC Urinalysis Dipstick OB    Myna Hidalgo, DO Attending Obstetrician & Gynecologist, [facility] for [employer], [facility]

## 2022-07-19 LAB — URINALYSIS
Bilirubin, UA: NEGATIVE
Glucose, UA: NEGATIVE
Ketones, UA: NEGATIVE
Leukocytes,UA: NEGATIVE
Nitrite, UA: NEGATIVE
Protein,UA: NEGATIVE
RBC, UA: NEGATIVE
Specific Gravity, UA: 1.016 (ref 1.005–1.030)
Urobilinogen, Ur: 0.2 mg/dL (ref 0.2–1.0)
pH, UA: 7 (ref 5.0–7.5)

## 2022-07-20 LAB — URINE CULTURE

## 2022-08-08 ENCOUNTER — Other Ambulatory Visit: Payer: BC Managed Care – PPO

## 2022-08-08 ENCOUNTER — Ambulatory Visit (INDEPENDENT_AMBULATORY_CARE_PROVIDER_SITE_OTHER): Payer: BC Managed Care – PPO | Admitting: Advanced Practice Midwife

## 2022-08-08 VITALS — BP 126/73 | HR 92 | Wt 206.0 lb

## 2022-08-08 DIAGNOSIS — Z3A26 26 weeks gestation of pregnancy: Secondary | ICD-10-CM

## 2022-08-08 DIAGNOSIS — Z3480 Encounter for supervision of other normal pregnancy, unspecified trimester: Secondary | ICD-10-CM

## 2022-08-08 DIAGNOSIS — L299 Pruritus, unspecified: Secondary | ICD-10-CM

## 2022-08-08 DIAGNOSIS — Z131 Encounter for screening for diabetes mellitus: Secondary | ICD-10-CM

## 2022-08-08 NOTE — Progress Notes (Signed)
   LOW-RISK PREGNANCY VISIT Patient name: Angel Diaz MRN [id number]  Date of birth: [birth year] Chief Complaint:   Routine Prenatal Visit  History of Present Illness:   Angel Diaz is a 27 y.o. G2P1001 female at [redacted]w[redacted]d with an Estimated Date of Delivery: 11/11/22 being seen today for ongoing management of a low-risk pregnancy.  Today she reports has been itching on soles of feet and palms of hands for a few weeks. . Contractions: Not present (occasoinal mild cramps).  .  Movement: Present. denies leaking of fluid. Review of Systems:   Pertinent items are noted in HPI Denies abnormal vaginal discharge w/ itching/odor/irritation, headaches, visual changes, shortness of breath, chest pain, abdominal pain, severe nausea/vomiting, or problems with urination or bowel movements unless otherwise stated above. Pertinent History Reviewed:  Reviewed past medical,surgical, social, obstetrical and family history.  Reviewed problem list, medications and allergies. Physical Assessment:   Vitals:   08/08/22 0908  BP: 126/73  Pulse: 92  Weight: 206 lb (93.4 kg)  Body mass index is 34.28 kg/m.        Physical Examination:   General appearance: Well appearing, and in no distress  Mental status: Alert, oriented to person, place, and time  Skin: Warm & dry  Cardiovascular: Normal heart rate noted  Respiratory: Normal respiratory effort, no distress  Abdomen: Soft, gravid, nontender  Pelvic: Cervical exam deferred         Extremities:    Fetal Status: Fetal Heart Rate (bpm): 148 Fundal Height: 27 cm Movement: Present    Chaperone:  n/a   No results found for this or any previous visit (from the past 24 hour(s)).  Assessment & Plan:    Pregnancy: G2P1001 at [redacted]w[redacted]d 1. [redacted] weeks gestation of pregnancy   2. Supervision of other normal pregnancy, antepartum      Meds: No orders of the defined types were placed in this encounter.  Labs/procedures today: PN2, bile  acids  Plan:  Continue routine obstetrical care  Next visit: prefers in person    Reviewed: Preterm labor symptoms and general obstetric precautions including but not limited to vaginal bleeding, contractions, leaking of fluid and fetal movement were reviewed in detail with the patient.  All questions were answered. Has home bp cuff.. Check bp weekly, let us know if >140/90.   Follow-up: Return in about 4 weeks (around 09/05/2022) for LROB.  No future appointments.  Orders Placed This Encounter  Procedures   Bile acids, total   Jacklyn Shell DNP, CNM 08/08/2022 9:18 AM

## 2022-08-08 NOTE — Patient Instructions (Signed)
Angel Diaz, I greatly value your feedback.  If you receive a survey following your visit with [place] today, we appreciate you taking the time to fill it out.  Thanks, Cathie Beams, CNM   [facility] HAS MOVED!!! It is now [facility] ([address] [place], [place] [address]) Entrance located off of [address] Free 24/7 valet parking   Go to [email or web address] to register for FREE online childbirth classes    Call the office [phone]) or go to [facility] if: You begin to have strong, frequent contractions Your water breaks.  Sometimes it is a big gush of fluid, sometimes it is just a trickle that keeps getting your panties wet or running down your legs You have vaginal bleeding.  It is normal to have a small amount of spotting if your cervix was checked.  You don't feel your baby moving like normal.  If you don't, get you something to eat and drink and lay down and focus on feeling your baby move.  You should feel at least 10 movements in 2 hours.  If you don't, you should call the office or go to [facility].    Tdap Vaccine It is recommended that you get the Tdap vaccine during the third trimester of EACH pregnancy to help protect your baby from getting pertussis (whooping cough) 27-36 weeks is the BEST time to do this so that you can pass the protection on to your baby. During pregnancy is better than after pregnancy, but if you are unable to get it during pregnancy it will be offered at the hospital.  You will be offered this vaccine in the office after 27 weeks. If you do not have health insurance, you can get this vaccine at the health department or your family doctor Everyone who will be around your baby should also be up-to-date on their vaccines. Adults (who are not pregnant) only need 1 dose of Tdap during adulthood.   Third Trimester of Pregnancy The third trimester is from week 29 through week 42, months 7 through 9. The third  trimester is a time when the fetus is growing rapidly. At the end of the ninth month, the fetus is about 20 inches in length and weighs 6-10 pounds.  BODY CHANGES Your body goes through many changes during pregnancy. The changes vary from woman to woman.  Your weight will continue to increase. You can expect to gain 25-35 pounds (11-16 kg) by the end of the pregnancy. You may begin to get stretch marks on your hips, abdomen, and breasts. You may urinate more often because the fetus is moving lower into your pelvis and pressing on your bladder. You may develop or continue to have heartburn as a result of your pregnancy. You may develop constipation because certain hormones are causing the muscles that push waste through your intestines to slow down. You may develop hemorrhoids or swollen, bulging veins (varicose veins). You may have pelvic pain because of the weight gain and pregnancy hormones relaxing your joints between the bones in your pelvis. Backaches may result from overexertion of the muscles supporting your posture. You may have changes in your hair. These can include thickening of your hair, rapid growth, and changes in texture. Some women also have hair loss during or after pregnancy, or hair that feels dry or thin. Your hair will most likely return to normal after your baby is born. Your breasts will continue to grow and be tender. A  yellow discharge may leak from your breasts called colostrum. Your belly button may stick out. You may feel short of breath because of your expanding uterus. You may notice the fetus "dropping," or moving lower in your abdomen. You may have a bloody mucus discharge. This usually occurs a few days to a week before labor begins. Your cervix becomes thin and soft (effaced) near your due date. WHAT TO EXPECT AT YOUR PRENATAL EXAMS  You will have prenatal exams every 2 weeks until week 36. Then, you will have weekly prenatal exams. During a routine prenatal  visit: You will be weighed to make sure you and the fetus are growing normally. Your blood pressure is taken. Your abdomen will be measured to track your baby's growth. The fetal heartbeat will be listened to. Any test results from the previous visit will be discussed. You may have a cervical check near your due date to see if you have effaced. At around 36 weeks, your caregiver will check your cervix. At the same time, your caregiver will also perform a test on the secretions of the vaginal tissue. This test is to determine if a type of bacteria, Group B streptococcus, is present. Your caregiver will explain this further. Your caregiver may ask you: What your birth plan is. How you are feeling. If you are feeling the baby move. If you have had any abnormal symptoms, such as leaking fluid, bleeding, severe headaches, or abdominal cramping. If you have any questions. Other tests or screenings that may be performed during your third trimester include: Blood tests that check for low iron levels (anemia). Fetal testing to check the health, activity level, and growth of the fetus. Testing is done if you have certain medical conditions or if there are problems during the pregnancy. FALSE LABOR You may feel small, irregular contractions that eventually go away. These are called Braxton Hicks contractions, or false labor. Contractions may last for hours, days, or even weeks before true labor sets in. If contractions come at regular intervals, intensify, or become painful, it is best to be seen by your caregiver.  SIGNS OF LABOR  Menstrual-like cramps. Contractions that are 5 minutes apart or less. Contractions that start on the top of the uterus and spread down to the lower abdomen and back. A sense of increased pelvic pressure or back pain. A watery or bloody mucus discharge that comes from the vagina. If you have any of these signs before the 37th week of pregnancy, call your caregiver right away.  You need to go to the hospital to get checked immediately. HOME CARE INSTRUCTIONS  Avoid all smoking, herbs, alcohol, and unprescribed drugs. These chemicals affect the formation and growth of the baby. Follow your caregiver's instructions regarding medicine use. There are medicines that are either safe or unsafe to take during pregnancy. Exercise only as directed by your caregiver. Experiencing uterine cramps is a good sign to stop exercising. Continue to eat regular, healthy meals. Wear a good support bra for breast tenderness. Do not use hot tubs, steam rooms, or saunas. Wear your seat belt at all times when driving. Avoid raw meat, uncooked cheese, cat litter boxes, and soil used by cats. These carry germs that can cause birth defects in the baby. Take your prenatal vitamins. Try taking a stool softener (if your caregiver approves) if you develop constipation. Eat more high-fiber foods, such as fresh vegetables or fruit and whole grains. Drink plenty of fluids to keep your urine clear or pale yellow.  Take warm sitz baths to soothe any pain or discomfort caused by hemorrhoids. Use hemorrhoid cream if your caregiver approves. If you develop varicose veins, wear support hose. Elevate your feet for 15 minutes, 3-4 times a day. Limit salt in your diet. Avoid heavy lifting, wear low heal shoes, and practice good posture. Rest a lot with your legs elevated if you have leg cramps or low back pain. Visit your dentist if you have not gone during your pregnancy. Use a soft toothbrush to brush your teeth and be gentle when you floss. A sexual relationship may be continued unless your caregiver directs you otherwise. Do not travel far distances unless it is absolutely necessary and only with the approval of your caregiver. Take prenatal classes to understand, practice, and ask questions about the labor and delivery. Make a trial run to the hospital. Pack your hospital bag. Prepare the baby's  nursery. Continue to go to all your prenatal visits as directed by your caregiver. SEEK MEDICAL CARE IF: You are unsure if you are in labor or if your water has broken. You have dizziness. You have mild pelvic cramps, pelvic pressure, or nagging pain in your abdominal area. You have persistent nausea, vomiting, or diarrhea. You have a bad smelling vaginal discharge. You have pain with urination. SEEK IMMEDIATE MEDICAL CARE IF:  You have a fever. You are leaking fluid from your vagina. You have spotting or bleeding from your vagina. You have severe abdominal cramping or pain. You have rapid weight loss or gain. You have shortness of breath with chest pain. You notice sudden or extreme swelling of your face, hands, ankles, feet, or legs. You have not felt your baby move in over an hour. You have severe headaches that do not go away with medicine. You have vision changes. Document Released: 01/08/2001 Document Revised: 01/19/2013 Document Reviewed: 03/17/2012 [facility] Patient Information 2015 [place], [place]. This information is not intended to replace advice given to you by your health care provider. Make sure you discuss any questions you have with your health care provider.

## 2022-08-09 LAB — CBC
Hematocrit: 35.2 % (ref 34.0–46.6)
Hemoglobin: 11.6 g/dL (ref 11.1–15.9)
MCH: 28.7 pg (ref 26.6–33.0)
MCHC: 33 g/dL (ref 31.5–35.7)
MCV: 87 fL (ref 79–97)
Platelets: 359 10*3/uL (ref 150–450)
RBC: 4.04 x10E6/uL (ref 3.77–5.28)
RDW: 12.2 % (ref 11.7–15.4)
WBC: 11.1 10*3/uL — ABNORMAL HIGH (ref 3.4–10.8)

## 2022-08-09 LAB — ANTIBODY SCREEN: Antibody Screen: NEGATIVE

## 2022-08-09 LAB — RPR: RPR Ser Ql: NONREACTIVE

## 2022-08-09 LAB — GLUCOSE TOLERANCE, 2 HOURS W/ 1HR
Glucose, 1 hour: 134 mg/dL (ref 70–179)
Glucose, 2 hour: 110 mg/dL (ref 70–152)
Glucose, Fasting: 74 mg/dL (ref 70–91)

## 2022-08-09 LAB — HIV ANTIBODY (ROUTINE TESTING W REFLEX): HIV Screen 4th Generation wRfx: NONREACTIVE

## 2022-08-10 LAB — BILE ACIDS, TOTAL: Bile Acids Total: 1.3 umol/L (ref 0.0–10.0)

## 2022-09-05 ENCOUNTER — Encounter: Payer: BC Managed Care – PPO | Admitting: Obstetrics & Gynecology

## 2022-09-11 ENCOUNTER — Encounter: Payer: Self-pay | Admitting: Advanced Practice Midwife

## 2022-09-11 ENCOUNTER — Ambulatory Visit (INDEPENDENT_AMBULATORY_CARE_PROVIDER_SITE_OTHER): Payer: BC Managed Care – PPO | Admitting: Advanced Practice Midwife

## 2022-09-11 VITALS — BP 119/77 | HR 88 | Wt 213.0 lb

## 2022-09-11 DIAGNOSIS — Z3A31 31 weeks gestation of pregnancy: Secondary | ICD-10-CM

## 2022-09-11 DIAGNOSIS — Z3480 Encounter for supervision of other normal pregnancy, unspecified trimester: Secondary | ICD-10-CM

## 2022-09-11 DIAGNOSIS — Z23 Encounter for immunization: Secondary | ICD-10-CM

## 2022-09-11 DIAGNOSIS — Z3483 Encounter for supervision of other normal pregnancy, third trimester: Secondary | ICD-10-CM

## 2022-09-11 DIAGNOSIS — R8290 Unspecified abnormal findings in urine: Secondary | ICD-10-CM

## 2022-09-11 LAB — POCT URINALYSIS DIPSTICK OB
Glucose, UA: NEGATIVE
Ketones, UA: NEGATIVE
Leukocytes, UA: NEGATIVE
Nitrite, UA: NEGATIVE
POC,PROTEIN,UA: NEGATIVE

## 2022-09-11 NOTE — Progress Notes (Signed)
   LOW-RISK PREGNANCY VISIT Patient name: Angel Diaz MRN [id number]  Date of birth: [birth year] Chief Complaint:   Routine Prenatal Visit  History of Present Illness:   Angel Diaz is a 27 y.o. G40P1001 female at [redacted]w[redacted]d with an Estimated Date of Delivery: 11/11/22 being seen today for ongoing management of a low-risk pregnancy.  Today she reports  itching is a bit improved; noticing small 'bulge' at umbilicus w standing; reports cloudy urine but no dysuria . Contractions: Not present. Vag. Bleeding: None.  Movement: Present. denies leaking of fluid. Review of Systems:   Pertinent items are noted in HPI Denies abnormal vaginal discharge w/ itching/odor/irritation, headaches, visual changes, shortness of breath, chest pain, abdominal pain, severe nausea/vomiting, or problems with urination or bowel movements unless otherwise stated above. Pertinent History Reviewed:  Reviewed past medical,surgical, social, obstetrical and family history.  Reviewed problem list, medications and allergies. Physical Assessment:   Vitals:   09/11/22 1448  BP: 119/77  Pulse: 88  Weight: 213 lb (96.6 kg)  Body mass index is 35.45 kg/m.        Physical Examination:   General appearance: Well appearing, and in no distress  Mental status: Alert, oriented to person, place, and time  Skin: Warm & dry  Cardiovascular: Normal heart rate noted  Respiratory: Normal respiratory effort, no distress  Abdomen: Soft, gravid, nontender  Pelvic: Cervical exam deferred         Extremities: Edema: None  Fetal Status: Fetal Heart Rate (bpm): 145 Fundal Height: 31 cm Movement: Present    Results for orders placed or performed in visit on 09/11/22 (from the past 24 hour(s))  POC Urinalysis Dipstick OB   Collection Time: 09/11/22  2:58 PM  Result Value Ref Range   Color, UA     Clarity, UA     Glucose, UA Negative Negative   Bilirubin, UA     Ketones, UA negative    Spec Grav, UA     Blood, UA  trace-intact    pH, UA     POC,PROTEIN,UA Negative Negative, Trace, Small (1+), Moderate (2+), Large (3+), 4+   Urobilinogen, UA     Nitrite, UA Negative    Leukocytes, UA Negative Negative   Appearance     Odor      Assessment & Plan:  1) Low-risk pregnancy G2P1001 at [redacted]w[redacted]d with an Estimated Date of Delivery: 11/11/22   2) Cloudy urine, neg UA  3) Umbilical hernia, about 2cm, soft   Meds: No orders of the defined types were placed in this encounter.  Labs/procedures today: UA; Tdap  Plan:  Continue routine obstetrical care   Reviewed: Preterm labor symptoms and general obstetric precautions including but not limited to vaginal bleeding, contractions, leaking of fluid and fetal movement were reviewed in detail with the patient.  All questions were answered. Has home bp cuff. Check bp weekly, let us know if >140/90.   Follow-up: Return in about 2 weeks (around 09/25/2022) for LROB, MyChart video.  Orders Placed This Encounter  Procedures   Tdap vaccine greater than or equal to 7yo IM   POC Urinalysis Dipstick OB   [name] [facility] 09/11/2022 4:19 PM

## 2022-09-11 NOTE — Patient Instructions (Signed)
Noreene Larsson, thank you for choosing our office today! We appreciate the opportunity to meet your healthcare needs. You may receive a short survey by mail, e-mail, or through [employer]. If you are happy with your care we would appreciate if you could take just a few minutes to complete the survey questions. We read all of your comments and take your feedback very seriously. Thank you again for choosing our office.  Center for [employer] Team at [facility]  [facility] at [facility] ([address] [place], [place] [address]) Entrance C, located off of [address] Free 24/7 valet parking   CLASSES: Go to [email or web address] to register for classes (childbirth, breastfeeding, waterbirth, infant CPR, daddy bootcamp, etc.)  Call the office [phone]) or go to [facility] if: You begin to have strong, frequent contractions Your water breaks.  Sometimes it is a big gush of fluid, sometimes it is just a trickle that keeps getting your panties wet or running down your legs You have vaginal bleeding.  It is normal to have a small amount of spotting if your cervix was checked.  You don't feel your baby moving like normal.  If you don't, get you something to eat and drink and lay down and focus on feeling your baby move.   If your baby is still not moving like normal, you should call the office or go to [facility].  Call the office [phone]) or go to [facility] hospital for these signs of pre-eclampsia: Severe headache that does not go away with Tylenol Visual changes- seeing spots, double, blurred vision Pain under your right breast or upper abdomen that does not go away with Tums or heartburn medicine Nausea and/or vomiting Severe swelling in your hands, feet, and face   Tdap Vaccine It is recommended that you get the Tdap vaccine during the third trimester of EACH pregnancy to help protect your baby from getting pertussis (whooping cough) 27-36 weeks is the BEST time to do  this so that you can pass the protection on to your baby. During pregnancy is better than after pregnancy, but if you are unable to get it during pregnancy it will be offered at the hospital.  You can get this vaccine with [place], at the health department, your family doctor, or some local pharmacies Everyone who will be around your baby should also be up-to-date on their vaccines before the baby comes. Adults (who are not pregnant) only need 1 dose of Tdap during adulthood.   [facility] Pediatricians/Family Doctors  [facility]): [address] Colette Ribas, [phone]           [facility]: [address], [phone]                [facility] Medicine [facility]): [address], [phone] (call to ask if accepting patients) [facility] Department: [address], [place], [phone]    [facility] Pediatricians/Family Doctors [facility]): [phone] [address], [phone] [facility]: [address] [place], [phone] [facility]: [address], [phone]  [facility] Doctors  [facility]): [phone] [facility]: [address], [phone]   [facility] Doctors [facility]: [address] [phone]  [facility] Family Doctors  [facility]: [phone], [phone]  Home Blood Pressure Monitoring for Patients   Your provider has recommended that you check your  blood pressure (BP) at least once a week at home. If you do not have a blood pressure cuff at home, one will be provided for you. Contact your provider if you have not received your monitor within 1 week.   Helpful Tips for Accurate Home Blood Pressure Checks  Don't smoke, exercise, or drink caffeine 30 minutes before checking your BP Use the restroom before checking your BP (a full bladder can raise your  pressure) Relax in a comfortable upright chair Feet on the ground Left arm resting comfortably on a flat surface at the level of your heart Legs uncrossed Back supported Sit quietly and don't talk Place the cuff on your bare arm Adjust snuggly, so that only two fingertips can fit between your skin and the top of the cuff Check 2 readings separated by at least one minute Keep a log of your BP readings For a visual, please reference this diagram: http://ccnc.care/bpdiagram  Provider Name: [facility]     Phone: [phone]  Zone 1: ALL CLEAR  Continue to monitor your symptoms:  BP reading is less than 140 (top number) or less than 90 (bottom number)  No right upper stomach pain No headaches or seeing spots No feeling nauseated or throwing up No swelling in face and hands  Zone 2: CAUTION Call your doctor's office for any of the following:  BP reading is greater than 140 (top number) or greater than 90 (bottom number)  Stomach pain under your ribs in the middle or right side Headaches or seeing spots Feeling nauseated or throwing up Swelling in face and hands  Zone 3: EMERGENCY  Seek immediate medical care if you have any of the following:  BP reading is greater than160 (top number) or greater than 110 (bottom number) Severe headaches not improving with Tylenol Serious difficulty catching your breath Any worsening symptoms from Zone 2   Third Trimester of Pregnancy The third trimester is from week 29 through week 42, months 7 through 9. The third trimester is a time when the fetus is growing rapidly. At the end of the ninth month, the fetus is about 20 inches in length and weighs 6-10 pounds.  BODY CHANGES Your body goes through many changes during pregnancy. The changes vary from woman to woman.  Your weight will continue to increase. You can expect to gain 25-35 pounds (11-16 kg) by the end of the pregnancy. You may begin to get stretch marks on your hips, abdomen,  and breasts. You may urinate more often because the fetus is moving lower into your pelvis and pressing on your bladder. You may develop or continue to have heartburn as a result of your pregnancy. You may develop constipation because certain hormones are causing the muscles that push waste through your intestines to slow down. You may develop hemorrhoids or swollen, bulging veins (varicose veins). You may have pelvic pain because of the weight gain and pregnancy hormones relaxing your joints between the bones in your pelvis. Backaches may result from overexertion of the muscles supporting your posture. You may have changes in your hair. These can include thickening of your hair, rapid growth, and changes in texture. Some women also have hair loss during or after pregnancy, or hair that feels dry or thin. Your hair will most likely return to normal after your baby is born. Your breasts will continue to grow and be tender. A yellow discharge may leak from your breasts called colostrum. Your belly button may stick out. You may  feel short of breath because of your expanding uterus. You may notice the fetus "dropping," or moving lower in your abdomen. You may have a bloody mucus discharge. This usually occurs a few days to a week before labor begins. Your cervix becomes thin and soft (effaced) near your due date. WHAT TO EXPECT AT YOUR PRENATAL EXAMS  You will have prenatal exams every 2 weeks until week 36. Then, you will have weekly prenatal exams. During a routine prenatal visit: You will be weighed to make sure you and the fetus are growing normally. Your blood pressure is taken. Your abdomen will be measured to track your baby's growth. The fetal heartbeat will be listened to. Any test results from the previous visit will be discussed. You may have a cervical check near your due date to see if you have effaced. At around 36 weeks, your caregiver will check your cervix. At the same time, your  caregiver will also perform a test on the secretions of the vaginal tissue. This test is to determine if a type of bacteria, Group B streptococcus, is present. Your caregiver will explain this further. Your caregiver may ask you: What your birth plan is. How you are feeling. If you are feeling the baby move. If you have had any abnormal symptoms, such as leaking fluid, bleeding, severe headaches, or abdominal cramping. If you have any questions. Other tests or screenings that may be performed during your third trimester include: Blood tests that check for low iron levels (anemia). Fetal testing to check the health, activity level, and growth of the fetus. Testing is done if you have certain medical conditions or if there are problems during the pregnancy. FALSE LABOR You may feel small, irregular contractions that eventually go away. These are called Braxton Hicks contractions, or false labor. Contractions may last for hours, days, or even weeks before true labor sets in. If contractions come at regular intervals, intensify, or become painful, it is best to be seen by your caregiver.  SIGNS OF LABOR  Menstrual-like cramps. Contractions that are 5 minutes apart or less. Contractions that start on the top of the uterus and spread down to the lower abdomen and back. A sense of increased pelvic pressure or back pain. A watery or bloody mucus discharge that comes from the vagina. If you have any of these signs before the 37th week of pregnancy, call your caregiver right away. You need to go to the hospital to get checked immediately. HOME CARE INSTRUCTIONS  Avoid all smoking, herbs, alcohol, and unprescribed drugs. These chemicals affect the formation and growth of the baby. Follow your caregiver's instructions regarding medicine use. There are medicines that are either safe or unsafe to take during pregnancy. Exercise only as directed by your caregiver. Experiencing uterine cramps is a good sign to  stop exercising. Continue to eat regular, healthy meals. Wear a good support bra for breast tenderness. Do not use hot tubs, steam rooms, or saunas. Wear your seat belt at all times when driving. Avoid raw meat, uncooked cheese, cat litter boxes, and soil used by cats. These carry germs that can cause birth defects in the baby. Take your prenatal vitamins. Try taking a stool softener (if your caregiver approves) if you develop constipation. Eat more high-fiber foods, such as fresh vegetables or fruit and whole grains. Drink plenty of fluids to keep your urine clear or pale yellow. Take warm sitz baths to soothe any pain or discomfort caused by hemorrhoids. Use hemorrhoid cream if  your caregiver approves. If you develop varicose veins, wear support hose. Elevate your feet for 15 minutes, 3-4 times a day. Limit salt in your diet. Avoid heavy lifting, wear low heal shoes, and practice good posture. Rest a lot with your legs elevated if you have leg cramps or low back pain. Visit your dentist if you have not gone during your pregnancy. Use a soft toothbrush to brush your teeth and be gentle when you floss. A sexual relationship may be continued unless your caregiver directs you otherwise. Do not travel far distances unless it is absolutely necessary and only with the approval of your caregiver. Take prenatal classes to understand, practice, and ask questions about the labor and delivery. Make a trial run to the hospital. Pack your hospital bag. Prepare the baby's nursery. Continue to go to all your prenatal visits as directed by your caregiver. SEEK MEDICAL CARE IF: You are unsure if you are in labor or if your water has broken. You have dizziness. You have mild pelvic cramps, pelvic pressure, or nagging pain in your abdominal area. You have persistent nausea, vomiting, or diarrhea. You have a bad smelling vaginal discharge. You have pain with urination. SEEK IMMEDIATE MEDICAL CARE IF:  You  have a fever. You are leaking fluid from your vagina. You have spotting or bleeding from your vagina. You have severe abdominal cramping or pain. You have rapid weight loss or gain. You have shortness of breath with chest pain. You notice sudden or extreme swelling of your face, hands, ankles, feet, or legs. You have not felt your baby move in over an hour. You have severe headaches that do not go away with medicine. You have vision changes. Document Released: 01/08/2001 Document Revised: 01/19/2013 Document Reviewed: 03/17/2012 [facility] Patient Information 2015 [place], [place]. This information is not intended to replace advice given to you by your health care provider. Make sure you discuss any questions you have with your health care provider.

## 2022-09-19 ENCOUNTER — Encounter: Payer: Self-pay | Admitting: Advanced Practice Midwife

## 2022-09-19 ENCOUNTER — Telehealth (INDEPENDENT_AMBULATORY_CARE_PROVIDER_SITE_OTHER): Payer: BC Managed Care – PPO | Admitting: Advanced Practice Midwife

## 2022-09-19 DIAGNOSIS — Z3480 Encounter for supervision of other normal pregnancy, unspecified trimester: Secondary | ICD-10-CM

## 2022-09-19 DIAGNOSIS — Z3483 Encounter for supervision of other normal pregnancy, third trimester: Secondary | ICD-10-CM

## 2022-09-19 DIAGNOSIS — Z3A32 32 weeks gestation of pregnancy: Secondary | ICD-10-CM

## 2022-09-19 NOTE — Progress Notes (Signed)
   I connected with Angel Diaz 09/19/22 at  9:10 AM EDT by: MyChart video and verified that I am speaking with the correct person using two identifiers.  Patient is located at [employer] and provider is located at home.     The purpose of this virtual visit is to provide medical care while limiting exposure to the novel coronavirus. I discussed the limitations, risks, security and privacy concerns of performing an evaluation and management service by MyChart video and the availability of in person appointments. I also discussed with the patient that there may be a patient responsible charge related to this service. By engaging in this virtual visit, you consent to the provision of healthcare.  Additionally, you authorize for your insurance to be billed for the services provided during this visit.  The patient expressed understanding and agreed to proceed.  The following staff members participated in the virtual visit:  Sheena, RN    [place] VISIT NOTE  Subjective:  Angel Diaz is a 27 y.o. G2P1001 at [redacted]w[redacted]d  for phone visit for ongoing prenatal care.  She is currently monitored for the following issues for this low-risk pregnancy and has Encounter for supervision of normal pregnancy, antepartum and UTI in pregnancy, antepartum, second trimester on their problem list.  Patient reports nightly ctx that last about an hour . PTL precautions discussed. Thinks itching is r/t dog allergies(now only itches on bottoms of feet if walks on the carpet in the areas where her dog goes). Has some concerns about BF (didn't go well last time--son was tongue tied w/a high palate, didn't pump enough, etc) . Plans to start collecting colostrum around 37 weeks. .  Contractions: Irritability. Vag. Bleeding: None.  Movement: Present. Denies leaking of fluid.   The following portions of the patient's history were reviewed and updated as appropriate: allergies, current medications, past family  history, past medical history, past social history, past surgical history and problem list.   Objective:   Vitals:   Self-Obtained  Fetal Status:     Movement: Present     Assessment and Plan:  Pregnancy: G2P1001 at [redacted]w[redacted]d 1. Supervision of other normal pregnancy, antepartum   Preterm labor symptoms and general obstetric precautions including but not limited to vaginal bleeding, contractions, leaking of fluid and fetal movement were reviewed in detail with the patient.  No follow-ups on file.  Future Appointments  Date Time Provider Department Center  10/03/2022  8:50 AM Jacklyn Shell, CNM [facility]  10/17/2022  9:10 AM Cheral Marker, CNM [facility]  10/24/2022  9:10 AM Marzella Schlein, Scarlette Calico, CNM [facility]     Time spent on virtual visit: 15 minutes  Jacklyn Shell, CNM

## 2022-10-02 DIAGNOSIS — Z23 Encounter for immunization: Secondary | ICD-10-CM | POA: Diagnosis not present

## 2022-10-02 DIAGNOSIS — Z3490 Encounter for supervision of normal pregnancy, unspecified, unspecified trimester: Secondary | ICD-10-CM | POA: Diagnosis not present

## 2022-10-03 ENCOUNTER — Encounter: Payer: Self-pay | Admitting: Advanced Practice Midwife

## 2022-10-03 ENCOUNTER — Ambulatory Visit (INDEPENDENT_AMBULATORY_CARE_PROVIDER_SITE_OTHER): Payer: BC Managed Care – PPO | Admitting: Advanced Practice Midwife

## 2022-10-03 VITALS — BP 118/77 | HR 125 | Wt 216.0 lb

## 2022-10-03 DIAGNOSIS — Z3483 Encounter for supervision of other normal pregnancy, third trimester: Secondary | ICD-10-CM

## 2022-10-03 DIAGNOSIS — Z3A34 34 weeks gestation of pregnancy: Secondary | ICD-10-CM

## 2022-10-03 DIAGNOSIS — Z1389 Encounter for screening for other disorder: Secondary | ICD-10-CM

## 2022-10-03 DIAGNOSIS — Z331 Pregnant state, incidental: Secondary | ICD-10-CM

## 2022-10-03 DIAGNOSIS — Z3480 Encounter for supervision of other normal pregnancy, unspecified trimester: Secondary | ICD-10-CM

## 2022-10-03 LAB — POCT URINALYSIS DIPSTICK OB
Glucose, UA: NEGATIVE
Ketones, UA: NEGATIVE
Leukocytes, UA: NEGATIVE
Nitrite, UA: NEGATIVE
POC,PROTEIN,UA: NEGATIVE

## 2022-10-03 NOTE — Patient Instructions (Addendum)
[name], I greatly value your feedback.  If you receive a survey following your visit with [place] today, we appreciate you taking the time to fill it out.  Thanks, Cathie Beams, DNP, CNM  [facility] HAS MOVED!!! It is now [facility] at [facility] ([address] [place], [place] [address]) Entrance located off of [address] Free 24/7 valet parking   Go to [email or web address] to register for FREE online childbirth classes    Call the office [phone]) or go to [facility] if: You begin to have strong, frequent contractions Your water breaks.  Sometimes it is a big gush of fluid, sometimes it is just a trickle that keeps getting your panties wet or running down your legs You have vaginal bleeding.  It is normal to have a small amount of spotting if your cervix was checked.  You don't feel your baby moving like normal.  If you don't, get you something to eat and drink and lay down and focus on feeling your baby move.  You should feel at least 10 movements in 2 hours.  If you don't, you should call the office or go to [facility].   Home Blood Pressure Monitoring for Patients   Your provider has recommended that you check your blood pressure (BP) at least once a week at home. If you do not have a blood pressure cuff at home, one will be provided for you. Contact your provider if you have not received your monitor within 1 week.   Helpful Tips for Accurate Home Blood Pressure Checks  Don't smoke, exercise, or drink caffeine 30 minutes before checking your BP Use the restroom before checking your BP (a full bladder can raise your pressure) Relax in a comfortable upright chair Feet on the ground Left arm resting comfortably on a flat surface at the level of your heart Legs uncrossed Back supported Sit quietly and don't talk Place the cuff on your bare arm Adjust snuggly, so that only two fingertips can fit between your skin and  the top of the cuff Check 2 readings separated by at least one minute Keep a log of your BP readings For a visual, please reference this diagram: http://ccnc.care/bpdiagram  Provider Name: [facility]     Phone: [phone]  Zone 1: ALL CLEAR  Continue to monitor your symptoms:  BP reading is less than 140 (top number) or less than 90 (bottom number)  No right upper stomach pain No headaches or seeing spots No feeling nauseated or throwing up No swelling in face and hands  Zone 2: CAUTION Call your doctor's office for any of the following:  BP reading is greater than 140 (top number) or greater than 90 (bottom number)  Stomach pain under your ribs in the middle or right side Headaches or seeing spots Feeling nauseated or throwing up Swelling in face and hands  Zone 3: EMERGENCY  Seek immediate medical care if you have any of the following:  BP reading is greater than160 (top number) or greater than 110 (bottom number) Severe headaches not improving with Tylenol Serious difficulty catching your breath Any worsening symptoms from Zone 2    Echogenic Intracardiac Focus  During the second trimester, your baby's heart  will be looked at using an ultrasound. Occasionally, one or more bright spots are  seen in the heart. These spots are called an  echogenic intracardiac focus. They are usually  seen in the muscle wall of  the ventricles. An echogenic intracardiac focus is found in  1 out of every 20 to 30 pregnancies. It does  not affect the health of your baby or how their  heart develops.  The spots usually do not go away before  your baby is born. If they are still found on  an ultrasound after your baby is born, this is  considered normal and will not cause problems  for your baby. Causes The cause of an echogenic intracardiac focus  is unknown.  It is possible that calcium deposits in the  muscle wall of the ventricles may cause these  spots. Calcium is a  natural mineral found in  the body. Areas of the body that have more  calcium, such as bones, show up brighter on  an ultrasound. An echogenic intracardiac focus can be seen in  any pregnancy, but is more common in females  with Asian ancestry. Treatment There is no treatment for an echogenic  intracardiac focus. In most cases, additional  tests are not needed. Echogenic Intracardiac Focus The four chambers of your baby's heart will be  looked at during the ultrasound.  [facility] EDUCATION Prenatal Testing There is usually not an increased risk for Down  syndrome if there are no other concerns with  the ultrasound. If there are other concerns, you and your health  care provider can talk about prenatal tests  available during pregnancy. These tests can  provide information about whether there may  be a higher than expected chance for certain  problems such as chromosome conditions such  as Down syndrome. Your health care provider can help you decide  if prenatal testing is right for you.

## 2022-10-03 NOTE — Progress Notes (Signed)
   LOW-RISK PREGNANCY VISIT Patient name: Angel Diaz MRN [id number]  Date of birth: [birth year] Chief Complaint:   Routine Prenatal Visit (Want urine dip,see if has uti)  History of Present Illness:   Angel Diaz is a 27 y.o. G65P1001 female at [redacted]w[redacted]d with an Estimated Date of Delivery: 11/11/22 being seen today for ongoing management of a low-risk pregnancy.  Today she reports no complaints. Has some Left upper back pain w/certain movements. Contractions: Irregular.  .  Movement: Present. denies leaking of fluid. Review of Systems:   Pertinent items are noted in HPI Denies abnormal vaginal discharge w/ itching/odor/irritation, headaches, visual changes, shortness of breath, chest pain, abdominal pain, severe nausea/vomiting, or problems with urination or bowel movements unless otherwise stated above. Pertinent History Reviewed:  Reviewed past medical,surgical, social, obstetrical and family history.  Reviewed problem list, medications and allergies. Physical Assessment:   Vitals:   10/03/22 0854  BP: 118/77  Pulse: (!) 125  Weight: 216 lb (98 kg)  Body mass index is 35.94 kg/m.        Physical Examination:   General appearance: Well appearing, and in no distress  Mental status: Alert, oriented to person, place, and time  Skin: Warm & dry  Cardiovascular: Normal heart rate noted  Respiratory: Normal respiratory effort, no distress  Abdomen: Soft, gravid, nontender  Pelvic: Cervical exam deferred         Extremities: Edema: None  Fetal Status: Fetal Heart Rate (bpm): 148 Fundal Height: 35 cm Movement: Present    Chaperone:  N/A     Results for orders placed or performed in visit on 10/03/22 (from the past 24 hour(s))  POC Urinalysis Dipstick OB   Collection Time: 10/03/22  9:10 AM  Result Value Ref Range   Color, UA     Clarity, UA     Glucose, UA Negative Negative   Bilirubin, UA     Ketones, UA negative    Spec Grav, UA     Blood, UA trace    pH,  UA     POC,PROTEIN,UA Negative Negative, Trace, Small (1+), Moderate (2+), Large (3+), 4+   Urobilinogen, UA     Nitrite, UA negative    Leukocytes, UA Negative Negative   Appearance     Odor      Assessment & Plan:    Pregnancy: G2P1001 at [redacted]w[redacted]d 1. Supervision of other normal pregnancy, antepartum   2. [redacted] weeks gestation of pregnancy      Meds: No orders of the defined types were placed in this encounter.  Labs/procedures today: none  Plan:  Continue routine obstetrical care  Next visit: prefers will be in person for cultures     Reviewed: Preterm labor symptoms and general obstetric precautions including but not limited to vaginal bleeding, contractions, leaking of fluid and fetal movement were reviewed in detail with the patient.  All questions were answered. Has home bp cuff. Rx faxed . Check bp weekly, let us know if >140/90.   Follow-up: Return in about 2 weeks (around 10/17/2022) for LROB.  Future Appointments  Date Time Provider Department Center  10/17/2022  9:10 AM Cheral Marker, CNM [facility]  10/24/2022  9:10 AM Marzella Schlein, Scarlette Calico, CNM [facility]    Orders Placed This Encounter  Procedures   POC Urinalysis Dipstick OB   Jacklyn Shell DNP, CNM 10/03/2022 9:35 AM

## 2022-10-17 ENCOUNTER — Other Ambulatory Visit (HOSPITAL_COMMUNITY)
Admission: RE | Admit: 2022-10-17 | Discharge: 2022-10-17 | Disposition: A | Discharge status: Home / Self Care | Payer: BC Managed Care – PPO | Source: Ambulatory Visit | Attending: Women's Health | Admitting: Women's Health

## 2022-10-17 ENCOUNTER — Encounter: Payer: Self-pay | Admitting: Women's Health

## 2022-10-17 ENCOUNTER — Ambulatory Visit (INDEPENDENT_AMBULATORY_CARE_PROVIDER_SITE_OTHER): Payer: BC Managed Care – PPO | Admitting: Women's Health

## 2022-10-17 VITALS — BP 122/79 | HR 109 | Wt 216.4 lb

## 2022-10-17 DIAGNOSIS — R350 Frequency of micturition: Secondary | ICD-10-CM | POA: Diagnosis not present

## 2022-10-17 DIAGNOSIS — Z3480 Encounter for supervision of other normal pregnancy, unspecified trimester: Secondary | ICD-10-CM | POA: Diagnosis not present

## 2022-10-17 DIAGNOSIS — Z3483 Encounter for supervision of other normal pregnancy, third trimester: Secondary | ICD-10-CM | POA: Insufficient documentation

## 2022-10-17 DIAGNOSIS — Z3A36 36 weeks gestation of pregnancy: Secondary | ICD-10-CM | POA: Diagnosis not present

## 2022-10-17 LAB — POCT URINALYSIS DIPSTICK OB
Glucose, UA: NEGATIVE
Ketones, UA: NEGATIVE
Leukocytes, UA: NEGATIVE
Nitrite, UA: NEGATIVE
POC,PROTEIN,UA: NEGATIVE

## 2022-10-17 MED ORDER — NITROFURANTOIN MONOHYD MACRO 100 MG PO CAPS
100.0000 mg | ORAL_CAPSULE | Freq: Two times a day (BID) | ORAL | 0 refills | Status: DC
Start: 2022-10-17 — End: 2022-11-05

## 2022-10-17 NOTE — Addendum Note (Signed)
Addended by: Caralyn Guile on: 10/17/2022 09:55 AM   Modules accepted: Orders

## 2022-10-17 NOTE — Patient Instructions (Signed)
Angel Diaz, thank you for choosing our office today! We appreciate the opportunity to meet your healthcare needs. You may receive a short survey by mail, e-mail, or through [employer]. If you are happy with your care we would appreciate if you could take just a few minutes to complete the survey questions. We read all of your comments and take your feedback very seriously. Thank you again for choosing our office.  Center for [employer] Team at [facility]  [facility] at [facility] ([address] [place], [place] [address]) Entrance C, located off of [address] Free 24/7 valet parking   CLASSES: Go to [email or web address] to register for classes (childbirth, breastfeeding, waterbirth, infant CPR, daddy bootcamp, etc.)  Call the office [phone]) or go to [facility] if: You begin to have strong, frequent contractions Your water breaks.  Sometimes it is a big gush of fluid, sometimes it is just a trickle that keeps getting your panties wet or running down your legs You have vaginal bleeding.  It is normal to have a small amount of spotting if your cervix was checked.  You don't feel your baby moving like normal.  If you don't, get you something to eat and drink and lay down and focus on feeling your baby move.   If your baby is still not moving like normal, you should call the office or go to [facility].  Call the office [phone]) or go to [facility] hospital for these signs of pre-eclampsia: Severe headache that does not go away with Tylenol Visual changes- seeing spots, double, blurred vision Pain under your right breast or upper abdomen that does not go away with Tums or heartburn medicine Nausea and/or vomiting Severe swelling in your hands, feet, and face   Tdap Vaccine It is recommended that you get the Tdap vaccine during the third trimester of EACH pregnancy to help protect your baby from getting pertussis (whooping cough) 27-36 weeks is the BEST time to do  this so that you can pass the protection on to your baby. During pregnancy is better than after pregnancy, but if you are unable to get it during pregnancy it will be offered at the hospital.  You can get this vaccine with [place], at the health department, your family doctor, or some local pharmacies Everyone who will be around your baby should also be up-to-date on their vaccines before the baby comes. Adults (who are not pregnant) only need 1 dose of Tdap during adulthood.   [facility] Pediatricians/Family Doctors [facility]): [address] [place], [phone]           [facility]: [address], [phone]                [facility]): [address], [phone] (call to ask if accepting patients) [facility] Department: [address], [place], [phone]    [facility] Pediatricians/Family Doctors [facility]): [phone], [phone] [facility]: [address] [place], [phone] [facility]: [address], [phone]  [facility] Doctors  [facility]): [phone] [facility]: [address], [phone]   [facility] Doctors [facility]: [address] [phone]  [facility] Family Doctors  [facility]: [phone], [phone]  Home Blood Pressure Monitoring for Patients   Your provider has recommended that you check your  blood pressure (BP) at least once a week at home. If you do not have a blood pressure cuff at home, one will be provided for you. Contact your provider if you have not received your monitor within 1 week.   Helpful Tips for Accurate Home Blood Pressure Checks  Don't smoke, exercise, or drink caffeine 30 minutes before checking your BP Use the restroom before checking your BP (a full bladder can raise your  pressure) Relax in a comfortable upright chair Feet on the ground Left arm resting comfortably on a flat surface at the level of your heart Legs uncrossed Back supported Sit quietly and don't talk Place the cuff on your bare arm Adjust snuggly, so that only two fingertips can fit between your skin and the top of the cuff Check 2 readings separated by at least one minute Keep a log of your BP readings For a visual, please reference this diagram: http://ccnc.care/bpdiagram  Provider Name: [facility]     Phone: [phone]  Zone 1: ALL CLEAR  Continue to monitor your symptoms:  BP reading is less than 140 (top number) or less than 90 (bottom number)  No right upper stomach pain No headaches or seeing spots No feeling nauseated or throwing up No swelling in face and hands  Zone 2: CAUTION Call your doctor's office for any of the following:  BP reading is greater than 140 (top number) or greater than 90 (bottom number)  Stomach pain under your ribs in the middle or right side Headaches or seeing spots Feeling nauseated or throwing up Swelling in face and hands  Zone 3: EMERGENCY  Seek immediate medical care if you have any of the following:  BP reading is greater than160 (top number) or greater than 110 (bottom number) Severe headaches not improving with Tylenol Serious difficulty catching your breath Any worsening symptoms from Zone 2  Preterm Labor and Birth Information  The normal length of a pregnancy is 39-41 weeks. Preterm labor is when labor starts before 37 completed weeks of pregnancy. What are the risk factors for preterm labor? Preterm labor is more likely to occur in women who: Have certain infections during pregnancy such as a bladder infection, sexually transmitted infection, or infection inside the uterus (chorioamnionitis). Have a shorter-than-normal cervix. Have gone into preterm labor before. Have had surgery on their cervix. Are younger than age 55  or older than age 35. Are African American. Are pregnant with twins or multiple babies (multiple gestation). Take street drugs or smoke while pregnant. Do not gain enough weight while pregnant. Became pregnant shortly after having been pregnant. What are the symptoms of preterm labor? Symptoms of preterm labor include: Cramps similar to those that can happen during a menstrual period. The cramps may happen with diarrhea. Pain in the abdomen or lower back. Regular uterine contractions that may feel like tightening of the abdomen. A feeling of increased pressure in the pelvis. Increased watery or bloody mucus discharge from the vagina. Water breaking (ruptured amniotic sac). Why is it important to recognize signs of preterm labor? It is important to recognize signs of preterm labor because babies who are born prematurely may not be fully developed. This can put them at an increased risk for: Long-term (chronic) heart and lung problems. Difficulty immediately after birth with regulating body systems, including blood sugar, body temperature, heart rate, and breathing rate. Bleeding in the brain. Cerebral palsy. Learning difficulties. Death. These risks are highest for babies who are born before 34 weeks  of pregnancy. How is preterm labor treated? Treatment depends on the length of your pregnancy, your condition, and the health of your baby. It may involve: Having a stitch (suture) placed in your cervix to prevent your cervix from opening too early (cerclage). Taking or being given medicines, such as: Hormone medicines. These may be given early in pregnancy to help support the pregnancy. Medicine to stop contractions. Medicines to help mature the baby's lungs. These may be prescribed if the risk of delivery is high. Medicines to prevent your baby from developing cerebral palsy. If the labor happens before 34 weeks of pregnancy, you may need to stay in the hospital. What should I do if I  think I am in preterm labor? If you think that you are going into preterm labor, call your health care provider right away. How can I prevent preterm labor in future pregnancies? To increase your chance of having a full-term pregnancy: Do not use any tobacco products, such as cigarettes, chewing tobacco, and e-cigarettes. If you need help quitting, ask your health care provider. Do not use street drugs or medicines that have not been prescribed to you during your pregnancy. Talk with your health care provider before taking any herbal supplements, even if you have been taking them regularly. Make sure you gain a healthy amount of weight during your pregnancy. Watch for infection. If you think that you might have an infection, get it checked right away. Make sure to tell your health care provider if you have gone into preterm labor before. This information is not intended to replace advice given to you by your health care provider. Make sure you discuss any questions you have with your health care provider. Document Revised: 05/08/2018 Document Reviewed: 06/07/2015 Elsevier Patient Education  2020 [employer].

## 2022-10-17 NOTE — Progress Notes (Signed)
LOW-RISK PREGNANCY VISIT Patient name: Angel Diaz MRN [id number]  Date of birth: [birth year] Chief Complaint:   Routine Prenatal Visit  History of Present Illness:   Angel Diaz is a 27 y.o. G78P1001 female at [redacted]w[redacted]d with an Estimated Date of Delivery: 11/11/22 being seen today for ongoing management of a low-risk pregnancy.   Today she reports  urinary frequency, no pain/discomfort . Contractions: Irregular. Vag. Bleeding: None.  Movement: Present. denies leaking of fluid.     05/01/2022    2:27 PM 01/27/2020    8:53 AM 10/07/2019   11:22 AM 11/10/2017    3:08 PM  Depression screen PHQ 2/9  Decreased Interest 0 1 2 0  Down, Depressed, Hopeless 1 0 1 0  PHQ - 2 Score 1 1 3  0  Altered sleeping 0 1 0   Tired, decreased energy 2 1 2    Change in appetite 1 0 1   Feeling bad or failure about yourself  1 0 0   Trouble concentrating 0 0 1   Moving slowly or fidgety/restless 0 0 0   Suicidal thoughts 0 0 0   PHQ-9 Score 5 3 7          05/01/2022    2:27 PM 01/27/2020    8:54 AM 10/07/2019   11:22 AM  GAD 7 : Generalized Anxiety Score  Nervous, Anxious, on Edge 1 0 1  Control/stop worrying 0 0 0  Worry too much - different things 1 0 0  Trouble relaxing 1 1 2   Restless 0 0 0  Easily annoyed or irritable 1 1 1   Afraid - awful might happen 0 0 0  Total GAD 7 Score 4 2 4       Review of Systems:   Pertinent items are noted in HPI Denies abnormal vaginal discharge w/ itching/odor/irritation, headaches, visual changes, shortness of breath, chest pain, abdominal pain, severe nausea/vomiting, or problems with urination or bowel movements unless otherwise stated above. Pertinent History Reviewed:  Reviewed past medical,surgical, social, obstetrical and family history.  Reviewed problem list, medications and allergies. Physical Assessment:   Vitals:   10/17/22 0919  BP: 122/79  Pulse: (!) 109  Weight: 216 lb 6.4 oz (98.2 kg)  Body mass index is 36.01 kg/m.         Physical Examination:   General appearance: Well appearing, and in no distress  Mental status: Alert, oriented to person, place, and time  Skin: Warm & dry  Cardiovascular: Normal heart rate noted  Respiratory: Normal respiratory effort, no distress  Abdomen: Soft, gravid, nontender  Pelvic: Cervical exam performed  Dilation: Fingertip Effacement (%): Thick Station: Ballotable  Extremities: Edema: None  Fetal Status: Fetal Heart Rate (bpm): 150 Fundal Height: 36 cm Movement: Present Presentation: Vertex  Chaperone: Freddie Apley   Results for orders placed or performed in visit on 10/17/22 (from the past 24 hour(s))  POC Urinalysis Dipstick OB   Collection Time: 10/17/22  9:26 AM  Result Value Ref Range   Color, UA     Clarity, UA     Glucose, UA Negative Negative   Bilirubin, UA     Ketones, UA Negative    Spec Grav, UA     Blood, UA trace - intact    pH, UA     POC,PROTEIN,UA Negative Negative, Trace, Small (1+), Moderate (2+), Large (3+), 4+   Urobilinogen, UA     Nitrite, UA Negative    Leukocytes, UA Negative Negative   Appearance  Odor      Assessment & Plan:  1) Low-risk pregnancy G2P1001 at [redacted]w[redacted]d with an Estimated Date of Delivery: 11/11/22   2) Urinary frequency, rx macrobid, send urine cx   Meds: No orders of the defined types were placed in this encounter.  Labs/procedures today: GBS, GC/CT, and SVE, urine cx  Plan:  Continue routine obstetrical care  Next visit: prefers in person    Reviewed: Preterm labor symptoms and general obstetric precautions including but not limited to vaginal bleeding, contractions, leaking of fluid and fetal movement were reviewed in detail with the patient.  All questions were answered. Does have home bp cuff. Office bp cuff given: not applicable. Check bp weekly, let us know if consistently >140 and/or >90.  Follow-up: Return for As scheduled.  Future Appointments  Date Time Provider Department Center  10/24/2022  9:10  AM Jacklyn Shell, CNM [facility]  10/31/2022 10:10 AM Cheral Marker, CNM [facility]  11/07/2022  8:50 AM Myna Hidalgo, DO [facility]  11/14/2022  8:50 AM [facility] NURSE [facility]  11/14/2022  9:10 AM Marzella Schlein, Scarlette Calico, CNM [facility]    Orders Placed This Encounter  Procedures   POC Urinalysis Dipstick OB   Cheral Marker CNM, [facility] 10/17/2022 9:39 AM

## 2022-10-18 LAB — CERVICOVAGINAL ANCILLARY ONLY
Chlamydia: NEGATIVE
Comment: NEGATIVE
Comment: NORMAL
Neisseria Gonorrhea: NEGATIVE

## 2022-10-19 LAB — URINE CULTURE

## 2022-10-21 LAB — CULTURE, BETA STREP (GROUP B ONLY): Strep Gp B Culture: POSITIVE — AB

## 2022-10-24 ENCOUNTER — Encounter: Payer: Self-pay | Admitting: Advanced Practice Midwife

## 2022-10-24 ENCOUNTER — Other Ambulatory Visit (HOSPITAL_COMMUNITY)
Admission: RE | Admit: 2022-10-24 | Discharge: 2022-10-24 | Disposition: A | Discharge status: Home / Self Care | Payer: BC Managed Care – PPO | Source: Ambulatory Visit | Attending: Advanced Practice Midwife | Admitting: Advanced Practice Midwife

## 2022-10-24 ENCOUNTER — Ambulatory Visit (INDEPENDENT_AMBULATORY_CARE_PROVIDER_SITE_OTHER): Payer: BC Managed Care – PPO | Admitting: Advanced Practice Midwife

## 2022-10-24 VITALS — BP 115/76 | HR 90 | Wt 220.0 lb

## 2022-10-24 DIAGNOSIS — N898 Other specified noninflammatory disorders of vagina: Secondary | ICD-10-CM

## 2022-10-24 DIAGNOSIS — Z3A37 37 weeks gestation of pregnancy: Secondary | ICD-10-CM | POA: Insufficient documentation

## 2022-10-24 DIAGNOSIS — Z3483 Encounter for supervision of other normal pregnancy, third trimester: Secondary | ICD-10-CM | POA: Diagnosis present

## 2022-10-24 DIAGNOSIS — O99891 Other specified diseases and conditions complicating pregnancy: Secondary | ICD-10-CM | POA: Insufficient documentation

## 2022-10-24 LAB — CERVICOVAGINAL ANCILLARY ONLY
Bacterial Vaginitis (gardnerella): NEGATIVE
Candida Glabrata: NEGATIVE
Candida Vaginitis: NEGATIVE
Comment: NEGATIVE
Comment: NEGATIVE
Comment: NEGATIVE

## 2022-10-24 NOTE — Progress Notes (Signed)
LOW-RISK PREGNANCY VISIT Patient name: Angel Diaz MRN [id number]  Date of birth: 1995/06/12 Chief Complaint:   Routine Prenatal Visit (Vaginal irritation, Vaginal odor, no discharge)  History of Present Illness:   Angel Diaz is a 27 y.o. G77P1001 female at [redacted]w[redacted]d with an Estimated Date of Delivery: 11/11/22 being seen today for ongoing management of a low-risk pregnancy.  Today she reports . Contractions: Not present. Vag. Bleeding: None.  Movement: Present. denies leaking of fluid.  Has noticed a vagianl odor, wants to get checked for BV, declines cervical exam Review of Systems:   Pertinent items are noted in HPI Denies abnormal vaginal discharge w/ itching/odor/irritation, headaches, visual changes, shortness of breath, chest pain, abdominal pain, severe nausea/vomiting, or problems with urination or bowel movements unless otherwise stated above. Pertinent History Reviewed:  Reviewed past medical,surgical, social, obstetrical and family history.  Reviewed problem list, medications and allergies. Physical Assessment:   Vitals:   10/24/22 0912  BP: 115/76  Pulse: 90  Weight: 220 lb (99.8 kg)  Body mass index is 36.61 kg/m.        Physical Examination:   General appearance: Well appearing, and in no distress  Mental status: Alert, oriented to person, place, and time  Skin: Warm & dry  Cardiovascular: Normal heart rate noted  Respiratory: Normal respiratory effort, no distress  Abdomen: Soft, gravid, nontender  Pelvic: Cervical exam deferred         Extremities: Edema: None Chaperone:  Cordelia Pen, RN   Fetal Status: Fetal Heart Rate (bpm): 143 Fundal Height: 37 cm Movement: Present      No results found for this or any previous visit (from the past 24 hour(s)).  Assessment & Plan:    Pregnancy: G2P1001 at [redacted]w[redacted]d 1. [redacted] weeks gestation of pregnancy   2. Supervision of normal intrauterine pregnancy in multigravida, third trimester      Meds: No orders  of the defined types were placed in this encounter.  Labs/procedures today: none  Plan:  Continue routine obstetrical care  Next visit: prefers in person    Reviewed: Term labor symptoms and general obstetric precautions including but not limited to vaginal bleeding, contractions, leaking of fluid and fetal movement were reviewed in detail with the patient.  All questions were answered. Has home bp cuff.. Check bp weekly, let us know if >140/90.   Follow-up: No follow-ups on file.  Future Appointments  Date Time Provider Department Center  10/31/2022 10:10 AM Cheral Marker, CNM [facility]  11/07/2022  8:50 AM Myna Hidalgo, DO [facility]  11/14/2022  8:50 AM [facility] NURSE [facility]  11/14/2022  9:10 AM Cresenzo-Dishmon, Scarlette Calico, CNM [facility]    No orders of the defined types were placed in this encounter.  Jacklyn Shell DNP, CNM 10/24/2022 9:31 AM

## 2022-10-27 ENCOUNTER — Encounter: Payer: Self-pay | Admitting: Advanced Practice Midwife

## 2022-10-31 ENCOUNTER — Ambulatory Visit: Payer: BC Managed Care – PPO | Admitting: Women's Health

## 2022-10-31 ENCOUNTER — Encounter: Payer: Self-pay | Admitting: Women's Health

## 2022-10-31 VITALS — BP 122/76 | HR 97 | Wt 221.0 lb

## 2022-10-31 DIAGNOSIS — Z3480 Encounter for supervision of other normal pregnancy, unspecified trimester: Secondary | ICD-10-CM

## 2022-10-31 DIAGNOSIS — N390 Urinary tract infection, site not specified: Secondary | ICD-10-CM

## 2022-10-31 DIAGNOSIS — Z3A38 38 weeks gestation of pregnancy: Secondary | ICD-10-CM

## 2022-10-31 DIAGNOSIS — Z3483 Encounter for supervision of other normal pregnancy, third trimester: Secondary | ICD-10-CM

## 2022-10-31 LAB — POCT URINALYSIS DIPSTICK OB
Glucose, UA: NEGATIVE
Ketones, UA: NEGATIVE
Leukocytes, UA: NEGATIVE
Nitrite, UA: NEGATIVE
POC,PROTEIN,UA: NEGATIVE

## 2022-10-31 NOTE — Patient Instructions (Signed)
Angel Diaz, thank you for choosing our office today! We appreciate the opportunity to meet your healthcare needs. You may receive a short survey by mail, e-mail, or through [employer]. If you are happy with your care we would appreciate if you could take just a few minutes to complete the survey questions. We read all of your comments and take your feedback very seriously. Thank you again for choosing our office.  Center for [employer] Team at [facility]  [facility] at [facility] ([address] [place], [place] [address]) Entrance C, located off of [address] Free 24/7 valet parking   CLASSES: Go to [email or web address] to register for classes (childbirth, breastfeeding, waterbirth, infant CPR, daddy bootcamp, etc.)  Call the office [phone]) or go to [facility] if: You begin to have strong, frequent contractions Your water breaks.  Sometimes it is a big gush of fluid, sometimes it is just a trickle that keeps getting your panties wet or running down your legs You have vaginal bleeding.  It is normal to have a small amount of spotting if your cervix was checked.  You don't feel your baby moving like normal.  If you don't, get you something to eat and drink and lay down and focus on feeling your baby move.   If your baby is still not moving like normal, you should call the office or go to [facility].  Call the office [phone]) or go to [facility] hospital for these signs of pre-eclampsia: Severe headache that does not go away with Tylenol Visual changes- seeing spots, double, blurred vision Pain under your right breast or upper abdomen that does not go away with Tums or heartburn medicine Nausea and/or vomiting Severe swelling in your hands, feet, and face   [facility] Pediatricians/Family Doctors [facility]): [address] [place], [phone]           [facility]: [address], [phone]                 [facility]): [address], [phone] (call to ask if accepting patients) [facility] Department: [address], [place], [phone]    [facility] Pediatricians/Family Doctors [facility]): [address], [phone] [facility]: [address] [place], [phone] [facility]: [address], [phone]  [facility] Doctors  [facility]): [phone] [facility]: [address] [phone]   [facility] Doctors [facility]: [address] [phone]  [facility] Doctors  [facility]: [phone], [phone]  Home Blood Pressure Monitoring for Patients   Your provider has recommended that you check your blood pressure (BP) at least once a week at home. If you do not have a blood pressure cuff at home, one will be provided for you. Contact your provider if you have not received your monitor within 1 week.   Helpful Tips for Accurate Home Blood Pressure Checks  Don't smoke, exercise, or drink caffeine 30 minutes before checking your BP Use the restroom before checking your BP (a full bladder can raise your pressure) Relax in a comfortable upright chair Feet on the ground Left arm resting comfortably on a flat surface at the level of your heart Legs uncrossed Back supported Sit quietly and don't talk Place the cuff on your bare arm Adjust snuggly, so that only two fingertips  can fit between your skin and the top of the cuff Check 2 readings separated by at least one minute Keep a log of your BP readings For a visual, please reference this diagram: http://ccnc.care/bpdiagram  Provider Name: [facility]     Phone: [phone]  Zone 1: ALL CLEAR  Continue to monitor your symptoms:  BP reading is less than 140 (top number) or less than 90 (bottom number)  No right  upper stomach pain No headaches or seeing spots No feeling nauseated or throwing up No swelling in face and hands  Zone 2: CAUTION Call your doctor's office for any of the following:  BP reading is greater than 140 (top number) or greater than 90 (bottom number)  Stomach pain under your ribs in the middle or right side Headaches or seeing spots Feeling nauseated or throwing up Swelling in face and hands  Zone 3: EMERGENCY  Seek immediate medical care if you have any of the following:  BP reading is greater than160 (top number) or greater than 110 (bottom number) Severe headaches not improving with Tylenol Serious difficulty catching your breath Any worsening symptoms from Zone 2   Braxton Hicks Contractions Contractions of the uterus can occur throughout pregnancy, but they are not always a sign that you are in labor. You may have practice contractions called Braxton Hicks contractions. These false labor contractions are sometimes confused with true labor. What are Deberah Pelton contractions? Braxton Hicks contractions are tightening movements that occur in the muscles of the uterus before labor. Unlike true labor contractions, these contractions do not result in opening (dilation) and thinning of the cervix. Toward the end of pregnancy (32-34 weeks), Braxton Hicks contractions can happen more often and may become stronger. These contractions are sometimes difficult to tell apart from true labor because they can be very uncomfortable. You should not feel embarrassed if you go to the hospital with false labor. Sometimes, the only way to tell if you are in true labor is for your health care provider to look for changes in the cervix. The health care provider will do a physical exam and may monitor your contractions. If you are not in true labor, the exam should show that your cervix is not dilating and your water has not broken. If there are no other health problems associated with your  pregnancy, it is completely safe for you to be sent home with false labor. You may continue to have Braxton Hicks contractions until you go into true labor. How to tell the difference between true labor and false labor True labor Contractions last 30-70 seconds. Contractions become very regular. Discomfort is usually felt in the top of the uterus, and it spreads to the lower abdomen and low back. Contractions do not go away with walking. Contractions usually become more intense and increase in frequency. The cervix dilates and gets thinner. False labor Contractions are usually shorter and not as strong as true labor contractions. Contractions are usually irregular. Contractions are often felt in the front of the lower abdomen and in the groin. Contractions may go away when you walk around or change positions while lying down. Contractions get weaker and are shorter-lasting as time goes on. The cervix usually does not dilate or become thin. Follow these instructions at home:  Take over-the-counter and prescription medicines only as told by your health care provider. Keep up with your usual exercises and follow other instructions from your health care provider. Eat and drink lightly if you think  you are going into labor. If Braxton Hicks contractions are making you uncomfortable: Change your position from lying down or resting to walking, or change from walking to resting. Sit and rest in a tub of warm water. Drink enough fluid to keep your urine pale yellow. Dehydration may cause these contractions. Do slow and deep breathing several times an hour. Keep all follow-up prenatal visits as told by your health care provider. This is important. Contact a health care provider if: You have a fever. You have continuous pain in your abdomen. Get help right away if: Your contractions become stronger, more regular, and closer together. You have fluid leaking or gushing from your vagina. You pass  blood-tinged mucus (bloody show). You have bleeding from your vagina. You have low back pain that you never had before. You feel your baby's head pushing down and causing pelvic pressure. Your baby is not moving inside you as much as it used to. Summary Contractions that occur before labor are called Braxton Hicks contractions, false labor, or practice contractions. Braxton Hicks contractions are usually shorter, weaker, farther apart, and less regular than true labor contractions. True labor contractions usually become progressively stronger and regular, and they become more frequent. Manage discomfort from [facility] contractions by changing position, resting in a warm bath, drinking plenty of water, or practicing deep breathing. This information is not intended to replace advice given to you by your health care provider. Make sure you discuss any questions you have with your health care provider. Document Revised: 12/27/2016 Document Reviewed: 05/30/2016 Elsevier Patient Education  2020 [employer].

## 2022-10-31 NOTE — Progress Notes (Signed)
LOW-RISK PREGNANCY VISIT Patient name: Angel Diaz MRN [id number]  Date of birth: 1995/08/16 Chief Complaint:   Routine Prenatal Visit  History of Present Illness:   Angel Diaz is a 27 y.o. G14P1001 female at [redacted]w[redacted]d with an Estimated Date of Delivery: 11/11/22 being seen today for ongoing management of a low-risk pregnancy.   Today she reports  pain RUQ, feels muscular, hurts when bends over . Contractions: Irregular.  .  Movement: Present. denies leaking of fluid.     05/01/2022    2:27 PM 01/27/2020    8:53 AM 10/07/2019   11:22 AM 11/10/2017    3:08 PM  Depression screen PHQ 2/9  Decreased Interest 0 1 2 0  Down, Depressed, Hopeless 1 0 1 0  PHQ - 2 Score 1 1 3  0  Altered sleeping 0 1 0   Tired, decreased energy 2 1 2    Change in appetite 1 0 1   Feeling bad or failure about yourself  1 0 0   Trouble concentrating 0 0 1   Moving slowly or fidgety/restless 0 0 0   Suicidal thoughts 0 0 0   PHQ-9 Score 5 3 7          05/01/2022    2:27 PM 01/27/2020    8:54 AM 10/07/2019   11:22 AM  GAD 7 : Generalized Anxiety Score  Nervous, Anxious, on Edge 1 0 1  Control/stop worrying 0 0 0  Worry too much - different things 1 0 0  Trouble relaxing 1 1 2   Restless 0 0 0  Easily annoyed or irritable 1 1 1   Afraid - awful might happen 0 0 0  Total GAD 7 Score 4 2 4       Review of Systems:   Pertinent items are noted in HPI Denies abnormal vaginal discharge w/ itching/odor/irritation, headaches, visual changes, shortness of breath, chest pain, abdominal pain, severe nausea/vomiting, or problems with urination or bowel movements unless otherwise stated above. Pertinent History Reviewed:  Reviewed past medical,surgical, social, obstetrical and family history.  Reviewed problem list, medications and allergies. Physical Assessment:   Vitals:   10/31/22 1021  BP: 122/76  Pulse: 97  Weight: 221 lb (100.2 kg)  Body mass index is 36.78 kg/m.        Physical  Examination:   General appearance: Well appearing, and in no distress  Mental status: Alert, oriented to person, place, and time  Skin: Warm & dry  Cardiovascular: Normal heart rate noted  Respiratory: Normal respiratory effort, no distress  Abdomen: Soft, gravid, nontender  Pelvic: Cervical exam deferred         Extremities: Edema: None  Fetal Status: Fetal Heart Rate (bpm): 136 Fundal Height: 37 cm Movement: Present    Chaperone: N/A   Results for orders placed or performed in visit on 10/31/22 (from the past 24 hour(s))  POC Urinalysis Dipstick OB   Collection Time: 10/31/22 11:12 AM  Result Value Ref Range   Color, UA     Clarity, UA     Glucose, UA Negative Negative   Bilirubin, UA     Ketones, UA negative    Spec Grav, UA     Blood, UA tract    pH, UA     POC,PROTEIN,UA Negative Negative, Trace, Small (1+), Moderate (2+), Large (3+), 4+   Urobilinogen, UA     Nitrite, UA negative    Leukocytes, UA Negative Negative   Appearance     Odor  Assessment & Plan:  1) Low-risk pregnancy G2P1001 at [redacted]w[redacted]d with an Estimated Date of Delivery: 11/11/22   2) RUQ pain, per pt feels muscular, hurts when bends, declined flexeril, to let us know if worsens/changes or any new sx  3) H/O ASB x 2> wants urine checked today, will also send cx   Meds: No orders of the defined types were placed in this encounter.  Labs/procedures today: urine culture  Plan:  Continue routine obstetrical care  Next visit: prefers in person    Reviewed: Term labor symptoms and general obstetric precautions including but not limited to vaginal bleeding, contractions, leaking of fluid and fetal movement were reviewed in detail with the patient.  All questions were answered. Does have home bp cuff. Office bp cuff given: not applicable. Check bp weekly, let us know if consistently >140 and/or >90.  Follow-up: Return for As scheduled.  Future Appointments  Date Time Provider Department Center   11/07/2022  8:50 AM Myna Hidalgo, DO [facility]  11/14/2022  8:50 AM [facility] NURSE [facility]  11/14/2022  9:10 AM Cresenzo-Dishmon, Scarlette Calico, CNM [facility]    Orders Placed This Encounter  Procedures   Urine Culture   POC Urinalysis Dipstick OB   Cheral Marker CNM, [facility] 10/31/2022 11:21 AM

## 2022-11-02 LAB — URINE CULTURE

## 2022-11-05 ENCOUNTER — Encounter: Payer: Self-pay | Admitting: Obstetrics & Gynecology

## 2022-11-05 ENCOUNTER — Ambulatory Visit (INDEPENDENT_AMBULATORY_CARE_PROVIDER_SITE_OTHER): Payer: BC Managed Care – PPO | Admitting: Obstetrics & Gynecology

## 2022-11-05 VITALS — BP 122/79 | HR 96 | Wt 224.0 lb

## 2022-11-05 DIAGNOSIS — Z3480 Encounter for supervision of other normal pregnancy, unspecified trimester: Secondary | ICD-10-CM

## 2022-11-05 DIAGNOSIS — Z3483 Encounter for supervision of other normal pregnancy, third trimester: Secondary | ICD-10-CM

## 2022-11-05 DIAGNOSIS — Z3A39 39 weeks gestation of pregnancy: Secondary | ICD-10-CM

## 2022-11-05 NOTE — Progress Notes (Signed)
   LOW-RISK PREGNANCY VISIT Patient name: Dolora Ridgely MRN [id number]  Date of birth: 01-29-1995 Chief Complaint:   Routine Prenatal Visit (Wants babies position checked)  History of Present Illness:   Jessice Madill is a 27 y.o. G28P1001 female at [redacted]w[redacted]d with an Estimated Date of Delivery: 11/11/22 being seen today for ongoing management of a low-risk pregnancy.     05/01/2022    2:27 PM 01/27/2020    8:53 AM 10/07/2019   11:22 AM 11/10/2017    3:08 PM  Depression screen PHQ 2/9  Decreased Interest 0 1 2 0  Down, Depressed, Hopeless 1 0 1 0  PHQ - 2 Score 1 1 3  0  Altered sleeping 0 1 0   Tired, decreased energy 2 1 2    Change in appetite 1 0 1   Feeling bad or failure about yourself  1 0 0   Trouble concentrating 0 0 1   Moving slowly or fidgety/restless 0 0 0   Suicidal thoughts 0 0 0   PHQ-9 Score 5 3 7      Today she reports left hip pain. Contractions: Irritability. Vag. Bleeding: None.  Movement: Present. denies leaking of fluid. Review of Systems:   Pertinent items are noted in HPI Denies abnormal vaginal discharge w/ itching/odor/irritation, headaches, visual changes, shortness of breath, chest pain, abdominal pain, severe nausea/vomiting, or problems with urination or bowel movements unless otherwise stated above. Pertinent History Reviewed:  Reviewed past medical,surgical, social, obstetrical and family history.  Reviewed problem list, medications and allergies. Physical Assessment:   Vitals:   11/05/22 0926  BP: 122/79  Pulse: 96  Weight: 224 lb (101.6 kg)  Body mass index is 37.28 kg/m.        Physical Examination:   General appearance: Well appearing, and in no distress  Mental status: Alert, oriented to person, place, and time  Skin: Warm & dry  Cardiovascular: Normal heart rate noted  Respiratory: Normal respiratory effort, no distress  Abdomen: Soft, gravid, nontender  Pelvic: Cervical exam performed  Dilation: Fingertip Effacement (%):  Thick Station: Ballotable  Extremities:    Fetal Status: Fetal Heart Rate (bpm): 152 Fundal Height: 36 cm Movement: Present Presentation: Vertex  Chaperone:  Faith Rogue     No results found for this or any previous visit (from the past 24 hour(s)).  Assessment & Plan:  1) Low-risk pregnancy G2P1001 at [redacted]w[redacted]d with an Estimated Date of Delivery: 11/11/22  NST + OBV next week and make plans for IOL if undelivered    Meds: No orders of the defined types were placed in this encounter.  Labs/procedures today:   Plan:  Continue routine obstetrical care  Next visit: prefers in person     Follow-up: Return for cancel both scheduled appts, make appt next MOnday for OBV + NST.  No orders of the defined types were placed in this encounter.   Lazaro Arms, MD 11/05/2022 10:07 AM

## 2022-11-07 ENCOUNTER — Encounter: Payer: BC Managed Care – PPO | Admitting: Obstetrics & Gynecology

## 2022-11-10 ENCOUNTER — Inpatient Hospital Stay (HOSPITAL_COMMUNITY)
Admission: AD | Admit: 2022-11-10 | Discharge: 2022-11-12 | DRG: 807 | Disposition: A | Discharge status: Home / Self Care | Payer: BC Managed Care – PPO | Attending: Family Medicine | Admitting: Family Medicine

## 2022-11-10 ENCOUNTER — Encounter (HOSPITAL_COMMUNITY): Payer: Self-pay | Admitting: Family Medicine

## 2022-11-10 DIAGNOSIS — O99214 Obesity complicating childbirth: Secondary | ICD-10-CM | POA: Diagnosis present

## 2022-11-10 DIAGNOSIS — Z3A40 40 weeks gestation of pregnancy: Secondary | ICD-10-CM | POA: Diagnosis not present

## 2022-11-10 DIAGNOSIS — O99824 Streptococcus B carrier state complicating childbirth: Secondary | ICD-10-CM | POA: Diagnosis not present

## 2022-11-10 DIAGNOSIS — O9962 Diseases of the digestive system complicating childbirth: Secondary | ICD-10-CM | POA: Diagnosis not present

## 2022-11-10 DIAGNOSIS — Z8249 Family history of ischemic heart disease and other diseases of the circulatory system: Secondary | ICD-10-CM | POA: Diagnosis not present

## 2022-11-10 DIAGNOSIS — O4292 Full-term premature rupture of membranes, unspecified as to length of time between rupture and onset of labor: Secondary | ICD-10-CM | POA: Diagnosis not present

## 2022-11-10 DIAGNOSIS — Z882 Allergy status to sulfonamides status: Secondary | ICD-10-CM

## 2022-11-10 DIAGNOSIS — O99820 Streptococcus B carrier state complicating pregnancy: Secondary | ICD-10-CM | POA: Diagnosis not present

## 2022-11-10 DIAGNOSIS — O9902 Anemia complicating childbirth: Secondary | ICD-10-CM | POA: Diagnosis present

## 2022-11-10 DIAGNOSIS — K219 Gastro-esophageal reflux disease without esophagitis: Secondary | ICD-10-CM | POA: Diagnosis present

## 2022-11-10 DIAGNOSIS — Z833 Family history of diabetes mellitus: Secondary | ICD-10-CM | POA: Diagnosis not present

## 2022-11-10 DIAGNOSIS — O4290 Premature rupture of membranes, unspecified as to length of time between rupture and onset of labor, unspecified weeks of gestation: Secondary | ICD-10-CM | POA: Insufficient documentation

## 2022-11-10 DIAGNOSIS — Z3490 Encounter for supervision of normal pregnancy, unspecified, unspecified trimester: Secondary | ICD-10-CM

## 2022-11-10 DIAGNOSIS — O4202 Full-term premature rupture of membranes, onset of labor within 24 hours of rupture: Secondary | ICD-10-CM | POA: Diagnosis not present

## 2022-11-10 LAB — CBC
HCT: 34.3 % — ABNORMAL LOW (ref 36.0–46.0)
Hemoglobin: 11.1 g/dL — ABNORMAL LOW (ref 12.0–15.0)
MCH: 24.6 pg — ABNORMAL LOW (ref 26.0–34.0)
MCHC: 32.4 g/dL (ref 30.0–36.0)
MCV: 76.1 fL — ABNORMAL LOW (ref 80.0–100.0)
Platelets: 424 10*3/uL — ABNORMAL HIGH (ref 150–400)
RBC: 4.51 MIL/uL (ref 3.87–5.11)
RDW: 15.7 % — ABNORMAL HIGH (ref 11.5–15.5)
WBC: 11.9 10*3/uL — ABNORMAL HIGH (ref 4.0–10.5)
nRBC: 0 % (ref 0.0–0.2)

## 2022-11-10 LAB — POCT FERN TEST: POCT Fern Test: POSITIVE

## 2022-11-10 LAB — TYPE AND SCREEN
ABO/RH(D): O POS
Antibody Screen: NEGATIVE

## 2022-11-10 MED ORDER — ACETAMINOPHEN 325 MG PO TABS: 650.0000 mg | ORAL_TABLET | ORAL | Status: DC | PRN

## 2022-11-10 MED ORDER — LACTATED RINGERS IV SOLN: 500.0000 mL | INTRAVENOUS | Status: DC | PRN

## 2022-11-10 MED ORDER — SOD CITRATE-CITRIC ACID 500-334 MG/5ML PO SOLN: 30.0000 mL | ORAL | Status: DC | PRN

## 2022-11-10 MED ORDER — ONDANSETRON HCL 4 MG/2ML IJ SOLN: 4.0000 mg | Freq: Four times a day (QID) | INTRAMUSCULAR | Status: DC | PRN

## 2022-11-10 MED ORDER — PENICILLIN G POT IN DEXTROSE 60000 UNIT/ML IV SOLN
3.0000 10*6.[IU] | INTRAVENOUS | Status: DC
  Administered 2022-11-11 (×2): 3 10*6.[IU] via INTRAVENOUS
  Filled 2022-11-10 (×2): qty 50

## 2022-11-10 MED ORDER — OXYCODONE-ACETAMINOPHEN 5-325 MG PO TABS: 1.0000 | ORAL_TABLET | ORAL | Status: DC | PRN

## 2022-11-10 MED ORDER — LACTATED RINGERS IV SOLN: INTRAVENOUS | Status: DC

## 2022-11-10 MED ORDER — OXYCODONE-ACETAMINOPHEN 5-325 MG PO TABS: 2.0000 | ORAL_TABLET | ORAL | Status: DC | PRN

## 2022-11-10 MED ORDER — FLEET ENEMA RE ENEM: 1.0000 | ENEMA | RECTAL | Status: DC | PRN

## 2022-11-10 MED ORDER — OXYTOCIN BOLUS FROM INFUSION
333.0000 mL | Freq: Once | INTRAVENOUS | Status: AC
  Administered 2022-11-11: 333 mL via INTRAVENOUS

## 2022-11-10 MED ORDER — OXYTOCIN-SODIUM CHLORIDE 30-0.9 UT/500ML-% IV SOLN
2.5000 [IU]/h | INTRAVENOUS | Status: DC
  Administered 2022-11-11: 2.5 [IU]/h via INTRAVENOUS

## 2022-11-10 MED ORDER — SODIUM CHLORIDE 0.9 % IV SOLN
5.0000 10*6.[IU] | Freq: Once | INTRAVENOUS | Status: AC
  Administered 2022-11-10: 5 10*6.[IU] via INTRAVENOUS
  Filled 2022-11-10: qty 5

## 2022-11-10 MED ORDER — LIDOCAINE HCL (PF) 1 % IJ SOLN
30.0000 mL | INTRAMUSCULAR | Status: AC | PRN
  Administered 2022-11-11: 30 mL via SUBCUTANEOUS
  Filled 2022-11-10: qty 30

## 2022-11-10 NOTE — MAU Note (Signed)

## 2022-11-10 NOTE — MAU Note (Signed)
Angel Diaz is a 27 y.o. at [redacted]w[redacted]d here in MAU reporting: Possible ROM at 2100 with clear fluid. Pt reports feeling about 3-4 ctx since then. Pt rates the ctx 5/10 in her pelvis and lower back. +FM. Pt denies VB.   Onset of complaint: 2100 Pain score: 5/10 Vitals:   11/10/22 2215  BP: 136/85  Pulse: 93  Resp: 18  Temp: 98.6 F (37 C)  SpO2: 97%     FHT:135 Lab orders placed from triage:  mau labor

## 2022-11-10 NOTE — H&P (Signed)
Angel Diaz is an 27 y.o. G2P1001 [redacted]w[redacted]d female.   Chief Complaint: Leaking fluid HPI: reports PROM around 2115. Having some contractions but not really hurting yet.  [facility]   Nursing Staff Provider  Office Location [facility] Dating  11/11/2022, by Ultrasound  [facility] Model Traditional    Language  English Anatomy US  Normal, RVEICF "Angel Diaz"  Flu Vaccine  RSV vaccine 10/02/22 Genetic/Carrier Screen  NIPS: low risk FEMALE AFP:   neg Horizon: neg  TDaP Vaccine   09/11/22 Hgb A1C or  GTT  Third trimester - normal  COVID Vaccine    LAB RESULTS   Rhogam  O/Positive/-- (04/03 1458) N/a Blood Type O/Positive/-- (04/03 1458)   Baby Feeding Plan breast Antibody Negative (04/03 1458)  Contraception condoms Rubella 4.67 (04/03 1458)  Circumcision NA RPR Non Reactive (04/03 1458)   Pediatrician  [facility] HBsAg Negative (04/03 1458)   Support Person  HCVAb Non Reactive (04/03 1458)   Prenatal Classes  HIV Non Reactive (04/03 1458)     BTL Consent  GBS  POS (For PCN allergy, check sensitivities)   VBAC Consent  Pap 05/01/2022       DME Rx [x]  BP cuff [ ]  Weight Scale Waterbirth  [ ]  Class [ ]  Consent [ ]  CNM visit  PHQ9 & GAD7 [  ] new OB [  ] 28 weeks  [  ] 36 weeks Induction  [ ]  Orders Entered [ ] Foley Y/N       OB History     Gravida  2   Para  1   Term  1   Preterm  0   AB  0   Living  1      SAB  0   IAB  0   Ectopic  0   Multiple  0   Live Births  1           Past Medical History:  Diagnosis Date   UTI (urinary tract infection)     Past Surgical History:  Procedure Laterality Date   SPINE SURGERY     had growth at end of spine had removed after birth    wisdom tooth removal     2023    Family History  Problem Relation Age of Onset   Hypertension Father    Diabetes Father    Heart disease Paternal Grandmother    Cancer Cousin        breast   Social History:  reports that she has never smoked. She has never used  smokeless tobacco. She reports that she does not currently use alcohol. She reports that she does not use drugs.    Allergies  Allergen Reactions   Sulfa Antibiotics Rash    Medications Prior to Admission  Medication Sig Dispense Refill   Prenatal Vit-Fe Fumarate-FA (MULTIVITAMIN-PRENATAL) 27-0.8 MG TABS tablet Take 1 tablet by mouth daily at 12 noon.       A comprehensive review of systems was negative.  Objective: Blood pressure 119/75, pulse 74, temperature 98.1 F (36.7 C), temperature source Oral, resp. rate 18, height 5\' 5"  (1.651 m), weight 102.2 kg, last menstrual period 01/29/2022, SpO2 97%.  General appearance: alert, cooperative, and appears stated age Head: Normocephalic, without obvious abnormality, atraumatic Neck: supple, symmetrical, trachea midline Lungs:  normal effort Heart: regular rate and rhythm Abdomen:  gravid, non-tender Extremities: edema tr Skin: Skin color, texture, turgor normal. No rashes or lesions Neurologic: Grossly normal   Labs:  Results for orders placed or performed during the hospital encounter of 11/10/22 (from the past 24 hour(s))  POCT fern test     Status: Normal   Collection Time: 11/10/22 10:09 PM  Result Value Ref Range   POCT Fern Test Positive = ruptured amniotic membanes   CBC     Status: Abnormal   Collection Time: 11/10/22 10:34 PM  Result Value Ref Range   WBC 11.9 (H) 4.0 - 10.5 K/uL   RBC 4.51 3.87 - 5.11 MIL/uL   Hemoglobin 11.1 (L) 12.0 - 15.0 g/dL   HCT 82.9 (L) 56.2 - 13.0 %   MCV 76.1 (L) 80.0 - 100.0 fL   MCH 24.6 (L) 26.0 - 34.0 pg   MCHC 32.4 30.0 - 36.0 g/dL   RDW 86.5 (H) 78.4 - 69.6 %   Platelets 424 (H) 150 - 400 K/uL   nRBC 0.0 0.0 - 0.2 %  Type and screen [facility]     Status: None   Collection Time: 11/10/22 10:34 PM  Result Value Ref Range   ABO/RH(D) O POS    Antibody Screen NEG    Sample Expiration      11/13/2022,2359 Performed at [facility], [address]  [place], [place] [address]             ABO, Rh: --/--/O POS (10/13 2234)  Antibody: NEG (10/13 2234)  Rubella: 4.67 (04/03 1458)  RPR: Non Reactive (07/11 0846)  HBsAg: Negative (04/03 1458)  HIV: Non Reactive (07/11 0846)  GBS: Positive/-- (09/19 1130)    Radiology studies: No results found.  Prenatal Transfer Tool  Maternal Diabetes: No Genetic Screening: Normal NIPT Maternal Ultrasounds/Referrals: Normal Fetal Ultrasounds or other Referrals:  None Maternal Substance Abuse:  No Significant Maternal Medications:  None Significant Maternal Lab Results: Group B Strep positive  Assessment/Plan Active Problems:   Indication for care in labor or delivery   PROM (premature rupture of membranes)   Group B Streptococcus carrier, +RV culture, currently pregnant   #Labor: early - will walk, add pitocin if not progressing in 4 hours or so #Pain: moderate for now, desires epidural eventually #FWB: Category 1 #ID:  GBS positive on PCN   #MOF: Breast #MOC: condoms #Circ:  N/A due to fetal sex #Anemia:  Hgb is 11.1  Angel Diaz 11/10/2022, 11:56 PM

## 2022-11-11 ENCOUNTER — Encounter: Payer: BC Managed Care – PPO | Admitting: Obstetrics & Gynecology

## 2022-11-11 ENCOUNTER — Inpatient Hospital Stay (HOSPITAL_COMMUNITY): Admit: 2022-11-11 | Payer: BC Managed Care – PPO

## 2022-11-11 ENCOUNTER — Encounter (HOSPITAL_COMMUNITY): Payer: Self-pay | Admitting: Family Medicine

## 2022-11-11 ENCOUNTER — Inpatient Hospital Stay (HOSPITAL_COMMUNITY): Payer: BC Managed Care – PPO | Admitting: Anesthesiology

## 2022-11-11 ENCOUNTER — Other Ambulatory Visit: Payer: BC Managed Care – PPO

## 2022-11-11 DIAGNOSIS — O4202 Full-term premature rupture of membranes, onset of labor within 24 hours of rupture: Secondary | ICD-10-CM

## 2022-11-11 DIAGNOSIS — O99820 Streptococcus B carrier state complicating pregnancy: Secondary | ICD-10-CM

## 2022-11-11 DIAGNOSIS — Z3A40 40 weeks gestation of pregnancy: Secondary | ICD-10-CM

## 2022-11-11 LAB — RPR: RPR Ser Ql: NONREACTIVE

## 2022-11-11 MED ORDER — ONDANSETRON HCL 4 MG PO TABS: 4.0000 mg | ORAL_TABLET | ORAL | Status: DC | PRN

## 2022-11-11 MED ORDER — OXYTOCIN-SODIUM CHLORIDE 30-0.9 UT/500ML-% IV SOLN
INTRAVENOUS | Status: AC
  Filled 2022-11-11: qty 500

## 2022-11-11 MED ORDER — TETANUS-DIPHTH-ACELL PERTUSSIS 5-2.5-18.5 LF-MCG/0.5 IM SUSY: 0.5000 mL | PREFILLED_SYRINGE | Freq: Once | INTRAMUSCULAR | Status: DC

## 2022-11-11 MED ORDER — ZOLPIDEM TARTRATE 5 MG PO TABS: 5.0000 mg | ORAL_TABLET | Freq: Every evening | ORAL | Status: DC | PRN

## 2022-11-11 MED ORDER — PHENYLEPHRINE 80 MCG/ML (10ML) SYRINGE FOR IV PUSH (FOR BLOOD PRESSURE SUPPORT)
80.0000 ug | PREFILLED_SYRINGE | INTRAVENOUS | Status: AC | PRN
  Administered 2022-11-11 (×3): 80 ug via INTRAVENOUS

## 2022-11-11 MED ORDER — ONDANSETRON HCL 4 MG/2ML IJ SOLN: 4.0000 mg | INTRAMUSCULAR | Status: DC | PRN

## 2022-11-11 MED ORDER — DIPHENHYDRAMINE HCL 25 MG PO CAPS: 25.0000 mg | ORAL_CAPSULE | Freq: Four times a day (QID) | ORAL | Status: DC | PRN

## 2022-11-11 MED ORDER — OXYTOCIN-SODIUM CHLORIDE 30-0.9 UT/500ML-% IV SOLN
1.0000 m[IU]/min | INTRAVENOUS | Status: DC
  Administered 2022-11-11: 2 m[IU]/min via INTRAVENOUS

## 2022-11-11 MED ORDER — SENNOSIDES-DOCUSATE SODIUM 8.6-50 MG PO TABS
2.0000 | ORAL_TABLET | ORAL | Status: DC
  Administered 2022-11-11: 2 via ORAL
  Filled 2022-11-11: qty 2

## 2022-11-11 MED ORDER — SIMETHICONE 80 MG PO CHEW: 80.0000 mg | CHEWABLE_TABLET | ORAL | Status: DC | PRN

## 2022-11-11 MED ORDER — FENTANYL CITRATE (PF) 100 MCG/2ML IJ SOLN
100.0000 ug | INTRAMUSCULAR | Status: DC | PRN
  Administered 2022-11-11 (×2): 100 ug via INTRAVENOUS
  Filled 2022-11-11 (×2): qty 2

## 2022-11-11 MED ORDER — IBUPROFEN 600 MG PO TABS
600.0000 mg | ORAL_TABLET | Freq: Four times a day (QID) | ORAL | Status: DC
  Administered 2022-11-11 – 2022-11-12 (×4): 600 mg via ORAL
  Filled 2022-11-11 (×4): qty 1

## 2022-11-11 MED ORDER — LIDOCAINE HCL (PF) 1 % IJ SOLN
INTRAMUSCULAR | Status: DC | PRN
  Administered 2022-11-11 (×2): 5 mL via EPIDURAL

## 2022-11-11 MED ORDER — BENZOCAINE-MENTHOL 20-0.5 % EX AERO: 1.0000 | INHALATION_SPRAY | CUTANEOUS | Status: DC | PRN

## 2022-11-11 MED ORDER — COCONUT OIL OIL: 1.0000 | TOPICAL_OIL | Status: DC | PRN

## 2022-11-11 MED ORDER — EPHEDRINE 5 MG/ML INJ: 10.0000 mg | INTRAVENOUS | Status: DC | PRN

## 2022-11-11 MED ORDER — LACTATED RINGERS IV SOLN: 500.0000 mL | Freq: Once | INTRAVENOUS | Status: DC

## 2022-11-11 MED ORDER — DIPHENHYDRAMINE HCL 50 MG/ML IJ SOLN
12.5000 mg | INTRAMUSCULAR | Status: DC | PRN
  Administered 2022-11-11: 12.5 mg via INTRAVENOUS
  Filled 2022-11-11: qty 1

## 2022-11-11 MED ORDER — PRENATAL MULTIVITAMIN CH
1.0000 | ORAL_TABLET | Freq: Every day | ORAL | Status: DC
  Administered 2022-11-11 – 2022-11-12 (×2): 1 via ORAL
  Filled 2022-11-11 (×2): qty 1

## 2022-11-11 MED ORDER — PHENYLEPHRINE 80 MCG/ML (10ML) SYRINGE FOR IV PUSH (FOR BLOOD PRESSURE SUPPORT)
80.0000 ug | PREFILLED_SYRINGE | INTRAVENOUS | Status: DC | PRN
  Filled 2022-11-11: qty 10

## 2022-11-11 MED ORDER — DIBUCAINE (PERIANAL) 1 % EX OINT: 1.0000 | TOPICAL_OINTMENT | CUTANEOUS | Status: DC | PRN

## 2022-11-11 MED ORDER — FENTANYL-BUPIVACAINE-NACL 0.5-0.125-0.9 MG/250ML-% EP SOLN
12.0000 mL/h | EPIDURAL | Status: DC | PRN
  Administered 2022-11-11: 12 mL/h via EPIDURAL
  Filled 2022-11-11: qty 250

## 2022-11-11 MED ORDER — WITCH HAZEL-GLYCERIN EX PADS: 1.0000 | MEDICATED_PAD | CUTANEOUS | Status: DC | PRN

## 2022-11-11 MED ORDER — TERBUTALINE SULFATE 1 MG/ML IJ SOLN: 0.2500 mg | Freq: Once | INTRAMUSCULAR | Status: DC | PRN

## 2022-11-11 MED ORDER — ACETAMINOPHEN 325 MG PO TABS: 650.0000 mg | ORAL_TABLET | ORAL | Status: DC | PRN

## 2022-11-11 NOTE — Anesthesia Postprocedure Evaluation (Signed)
Anesthesia Post Note  Patient: Angel Diaz  Procedure(s) Performed: AN AD HOC LABOR EPIDURAL     Patient location during evaluation: Mother Baby Anesthesia Type: Epidural Level of consciousness: awake and alert Pain management: pain level controlled Vital Signs Assessment: post-procedure vital signs reviewed and stable Respiratory status: spontaneous breathing, nonlabored ventilation and respiratory function stable Cardiovascular status: stable Postop Assessment: no headache, no backache and epidural receding Anesthetic complications: no   No notable events documented.  Last Vitals:  Vitals:   11/11/22 1353 11/11/22 1502  BP: 120/70 118/79  Pulse: 75 83  Resp: 20 20  Temp: 37.1 C 37.1 C  SpO2: 98%     Last Pain:  Vitals:   11/11/22 1502  TempSrc:   PainSc: 3    Pain Goal:                   Parke Jandreau

## 2022-11-11 NOTE — Anesthesia Preprocedure Evaluation (Signed)
Anesthesia Evaluation  Patient identified by MRN, date of birth, ID band Patient awake    Reviewed: Allergy & Precautions, Patient's Chart, lab work & pertinent test results  Airway Mallampati: II       Dental  (+) Teeth Intact   Pulmonary neg pulmonary ROS   Pulmonary exam normal        Cardiovascular negative cardio ROS Normal cardiovascular exam Rhythm:Regular     Neuro/Psych negative neurological ROS  negative psych ROS   GI/Hepatic Neg liver ROS,GERD  ,,  Endo/Other  obesity  Renal/GU negative Renal ROS  negative genitourinary   Musculoskeletal negative musculoskeletal ROS (+)    Abdominal  (+) + obese  Peds  Hematology  (+) Blood dyscrasia, anemia   Anesthesia Other Findings   Reproductive/Obstetrics (+) Pregnancy                              Anesthesia Physical Anesthesia Plan  ASA: 2  Anesthesia Plan: Epidural   Post-op Pain Management:    Induction:   PONV Risk Score and Plan:   Airway Management Planned: Natural Airway  Additional Equipment:   Intra-op Plan:   Post-operative Plan:   Informed Consent: I have reviewed the patients History and Physical, chart, labs and discussed the procedure including the risks, benefits and alternatives for the proposed anesthesia with the patient or authorized representative who has indicated his/her understanding and acceptance.       Plan Discussed with: Anesthesiologist  Anesthesia Plan Comments:          Anesthesia Quick Evaluation

## 2022-11-11 NOTE — Lactation Note (Signed)
This note was copied from a baby's chart. Lactation Consultation Note  Patient Name: Angel Diaz [id number] Date: 11/11/2022 Age:28 hours Reason for consult: Follow-up assessment;Mother's request;Term  P2- LC called to room to assist MOB with latching infant. RN had already latched infant for LC. Infant was doing great and so was MOB, LC encouraged MOB to call for further assistance if needed.  Maternal Data Has patient been taught Hand Expression?: Yes Does the patient have breastfeeding experience prior to this delivery?: Yes How long did the patient breastfeed?: 6 months, but states that he first child never latched and she had a very low supply.  Feeding Mother's Current Feeding Choice: Breast Milk  LATCH Score Latch: Repeated attempts needed to sustain latch, nipple held in mouth throughout feeding, stimulation needed to elicit sucking reflex.  Audible Swallowing: A few with stimulation  Type of Nipple: Everted at rest and after stimulation  Comfort (Breast/Nipple): Soft / non-tender  Hold (Positioning): Assistance needed to correctly position infant at breast and maintain latch.  LATCH Score: 7   Lactation Tools Discussed/Used Pump Education: Milk Storage  Interventions Interventions: Breast feeding basics reviewed;Education  Discharge Discharge Education: Warning signs for feeding baby Pump: DEBP;Hands Free;Personal  Consult Status Consult Status: Follow-up Date: 11/12/22 Follow-up type: In-patient    Dema Severin BS, IBCLC 11/11/2022, 7:35 PM

## 2022-11-11 NOTE — Lactation Note (Signed)
This note was copied from a baby's chart. Lactation Consultation Note  Patient Name: Angel Diaz [id number] Date: 11/11/2022 Age:28 hours Reason for consult: Initial assessment;Term;Breastfeeding assistance  P2- MOB states that she is happy because infant is nursing very well compared to her first child. According to MOB, her first child had a tongue tie and would bite. She ended up pumping exclusively for 6 months, but struggled with her supply.   It had been 3 hours since infant's last feeding, so LC offered to assist with latching infant. MOB agreed. LC demonstrated how to latch infant in the cross cradle position on the left breast. With a few attempts, infant latched and nursed for 10 minutes with some stimulation.   LC reviewed flange sizing, feeding infant on cue 8-12x in 24 hrs, not allowing infant to go over 3 hrs without a feeding, LC services handout and CDC milk storage guidelines. LC encouraged MOB to call for further assistance as needed.  Maternal Data Does the patient have breastfeeding experience prior to this delivery?: Yes How long did the patient breastfeed?: 6 months, but states that he first child never latched and she had a very low supply.  Feeding Mother's Current Feeding Choice: Breast Milk and Formula  LATCH Score Latch: Repeated attempts needed to sustain latch, nipple held in mouth throughout feeding, stimulation needed to elicit sucking reflex.  Audible Swallowing: A few with stimulation  Type of Nipple: Everted at rest and after stimulation  Comfort (Breast/Nipple): Soft / non-tender  Hold (Positioning): Assistance needed to correctly position infant at breast and maintain latch.  LATCH Score: 7   Lactation Tools Discussed/Used Pump Education: Milk Storage  Interventions Interventions: Breast feeding basics reviewed;Assisted with latch;Breast compression;Adjust position;Support pillows;Position options;Education;LC Services  brochure  Discharge Discharge Education: Engorgement and breast care;Warning signs for feeding baby Pump: DEBP;Hands Free;Personal  Consult Status Consult Status: Follow-up Date: 11/12/22 Follow-up type: In-patient    Dema Severin BS, IBCLC 11/11/2022, 4:19 PM

## 2022-11-11 NOTE — Anesthesia Procedure Notes (Signed)
Epidural Patient location during procedure: OB Start time: 11/11/2022 4:50 AM End time: 11/11/2022 4:59 AM  Staffing Anesthesiologist: Mal Amabile, MD Performed: anesthesiologist   Preanesthetic Checklist Completed: patient identified, IV checked, site marked, risks and benefits discussed, surgical consent, monitors and equipment checked, pre-op evaluation and timeout performed  Epidural Patient position: sitting Prep: DuraPrep and site prepped and draped Patient monitoring: continuous pulse ox and blood pressure Approach: midline Location: L3-L4 Injection technique: LOR air  Needle:  Needle type: Tuohy  Needle gauge: 17 G Needle length: 9 cm and 9 Needle insertion depth: 7 cm Catheter type: closed end flexible Catheter size: 19 Gauge Catheter at skin depth: 12 cm Test dose: negative and Other  Assessment Events: blood not aspirated, no cerebrospinal fluid, injection not painful, no injection resistance, no paresthesia and negative IV test  Additional Notes Patient identified. Risks and benefits discussed including failed block, incomplete  Pain control, post dural puncture headache, nerve damage, paralysis, blood pressure Changes, nausea, vomiting, reactions to medications-both toxic and allergic and post Partum back pain. All questions were answered. Patient expressed understanding and wished to proceed. Sterile technique was used throughout procedure. Epidural site was Dressed with sterile barrier dressing. No paresthesias, signs of intravascular injection Or signs of intrathecal spread were encountered.  Patient was more comfortable after the epidural was dosed. Please see RN's note for documentation of vital signs and FHR which are stable. Reason for block:procedure for pain

## 2022-11-11 NOTE — Discharge Summary (Signed)
Postpartum Discharge Summary  Date of Service updated***     Patient Name: Angel Diaz DOB: [birth year] MRN: [id number]  Date of admission: 11/10/2022 Delivery date:11/11/2022 Delivering provider: Celedonio Savage Date of discharge: 11/11/2022  Admitting diagnosis: Indication for care in labor or delivery [O75.9] Intrauterine pregnancy: [redacted]w[redacted]d     Secondary diagnosis:  Active Problems:   Vaginal delivery   Encounter for supervision of normal pregnancy, antepartum   Indication for care in labor or delivery   PROM (premature rupture of membranes)   Group B Streptococcus carrier, +RV culture, currently pregnant  Additional problems: None    Discharge diagnosis: Term Pregnancy Delivered                                              Post partum procedures:{Postpartum procedures:23558} Augmentation: Pitocin Complications: None  Hospital course: Onset of Labor With Vaginal Delivery      27 y.o. yo G2P1001 at [redacted]w[redacted]d was admitted in Latent Labor on 11/10/2022. Labor course was uncomplicated  Membrane Rupture Time/Date: 9:00 PM,11/10/2022  Delivery Method:Vaginal, Spontaneous Operative Delivery:N/A Episiotomy: None Lacerations:  1st degree Patient had a postpartum course complicated by ***.  She is ambulating, tolerating a regular diet, passing flatus, and urinating well. Patient is discharged home in stable condition on 11/11/22.  Newborn Data: Birth date:11/11/2022 Birth time:11:13 AM Gender:Female Living status:Living Apgars:9 ,9  Weight:   Magnesium Sulfate received: No BMZ received: No Rhophylac:N/A MMR:N/A T-DaP:Given prenatally Flu: {ONG:29528} RSV Vaccine received: {RSV:31013} Transfusion:{Transfusion received:30440034}  Immunizations received: Immunization History  Administered Date(s) Administered   Influenza,inj,Quad PF,6+ Mos 12/02/2019   PFIZER(Purple Top)SARS-COV-2 Vaccination 05/27/2019, 06/17/2019, 12/20/2019   Rabies, IM 10/04/2021,  10/07/2021   Tdap 03/29/2020, 09/11/2022    Physical exam  Vitals:   11/11/22 0900 11/11/22 0930 11/11/22 1001 11/11/22 1030  BP: 120/73 (!) 97/53 (!) 146/92 129/71  Pulse: 91 85 (!) 124 86  Resp:      Temp:      TempSrc:      SpO2:      Weight:      Height:       General: {Exam; general:21111117} Lochia: {Desc; appropriate/inappropriate:30686::"appropriate"} Uterine Fundus: {Desc; firm/soft:30687} Incision: {Exam; incision:21111123} DVT Evaluation: {Exam; dvt:2111122} Labs: Lab Results  Component Value Date   WBC 11.9 (H) 11/10/2022   HGB 11.1 (L) 11/10/2022   HCT 34.3 (L) 11/10/2022   MCV 76.1 (L) 11/10/2022   PLT 424 (H) 11/10/2022       No data to display         Edinburgh Score:    06/01/2020    1:41 PM  Edinburgh Postnatal Depression Scale Screening Tool  I have been able to laugh and see the funny side of things. 0  I have looked forward with enjoyment to things. 0  I have blamed myself unnecessarily when things went wrong. 0  I have been anxious or worried for no good reason. 2  I have felt scared or panicky for no good reason. 2  Things have been getting on top of me. 1  I have been so unhappy that I have had difficulty sleeping. 0  I have felt sad or miserable. 0  I have been so unhappy that I have been crying. 1  The thought of harming myself has occurred to me. 0  Edinburgh Postnatal Depression Scale Total 6  No data recorded  After visit meds:  Allergies as of 11/11/2022       Reactions   Sulfa Antibiotics Rash     Med Rec must be completed prior to using this [facility]***        Discharge home in stable condition Infant Feeding: {Baby feeding:23562} Infant Disposition:{CHL IP OB HOME WITH ZOXWRU:04540} Discharge instruction: per After Visit Summary and Postpartum booklet. Activity: Advance as tolerated. Pelvic rest for 6 weeks.  Diet: {OB JWJX:91478295} Future Appointments:No future appointments. Follow up Visit:   Please  schedule this patient for a In person postpartum visit in 4 weeks with the following provider: Any provider. Additional Postpartum F/U: None   Low risk pregnancy complicated by:  None Delivery mode:  Vaginal, Spontaneous Anticipated Birth Control:  Condoms   11/11/2022 Celedonio Savage, MD

## 2022-11-11 NOTE — Progress Notes (Signed)
Labor Progress Note  Janie Capp is a 27 y.o. G2P1001 at [redacted]w[redacted]d presented for ROM  S: patient doing well per nursing, no concerns   O:  BP 116/79   Pulse 90   Temp 98.3 F (36.8 C) (Oral)   Resp 16   Ht 5\' 5"  (1.651 m)   Wt 102.2 kg   LMP 01/29/2022   SpO2 97%   BMI 37.51 kg/m  EFM:120bpm/Moderate variability/ 15x15 accels/ None decels CAT: 1 Toco: q12mins   CVE: Dilation: 1 Effacement (%): 50 Cervical Position: Posterior Station: -3 Exam by:: P. Onalee Hua, RN   A&P: 27 y.o. G2P1001 [redacted]w[redacted]d  here for SOL as above  #Labor: Pt agreeable to starting pitocin given little change to cervix and spaced CTX. #Pain: planning epidural, PRN IV for now #FWB: CAT 1 #GBS positive, PCN  Hessie Dibble, MD FMOB Fellow, Faculty practice [facility] 11/11/22  3:46 AM

## 2022-11-12 LAB — BIRTH TISSUE RECOVERY COLLECTION (PLACENTA DONATION)

## 2022-11-12 MED ORDER — IBUPROFEN 600 MG PO TABS: 600.0000 mg | ORAL_TABLET | Freq: Four times a day (QID) | ORAL | 0 refills | Status: AC

## 2022-11-12 MED ORDER — SENNOSIDES-DOCUSATE SODIUM 8.6-50 MG PO TABS: 2.0000 | ORAL_TABLET | ORAL | 0 refills | Status: DC

## 2022-11-12 NOTE — Lactation Note (Signed)
This note was copied from a baby's chart. Lactation Consultation Note  Patient Name: Angel Diaz [id number] Date: 11/12/2022 Age:28 hours  Reason for consult: Follow-up assessment;Breastfeeding assistance;Term  P2, [redacted]w[redacted]d, 4% weight loss  Follow up LC visit with P2 mother with history of difficulty breastfeeding and low milk supply with her last child. Mother states Baby Angel "Melody" has been latching well. Mother has small scabbed areas on her nipples but denies any discomfort with latch. She feels she was allowing her to latch swallow initially. Baby had recently fed but is alert and showing feeding cues.  Mother was receptive to assistance with latch and positioning. Basic breastfeeding education in cross cradle position. Infant had a wide gape and deep latch. Slight adjustment and education for getting infant's chin into the breast and alternate breast compression. Infant fed rhythmically with occasional swallow noted.  Due to mother's history of low milk supply, advised mother to feed baby with cues with goal of 8-12 feedings in 24 hours. Discussed pumping 3-4 times per day to stimulate milk production and feed baby any expressed milk. Also recommended an OP LC appointment for further support and evaluation of milk production and transfer. Mother agreeable to above and plans to check her insurance regarding OP services.  Mom made aware of O/P services, breastfeeding support groups, and our phone # for post-discharge questions.       Feeding Mother's Current Feeding Choice: Breast Milk  LATCH Score Latch: Grasps breast easily, tongue down, lips flanged, rhythmical sucking.  Audible Swallowing: A few with stimulation  Type of Nipple: Everted at rest and after stimulation  Comfort (Breast/Nipple): Soft / non-tender  Hold (Positioning): Assistance needed to correctly position infant at breast and maintain latch.  LATCH Score: 8   Lactation Tools Discussed/Used     Interventions Interventions: Breast feeding basics reviewed;Assisted with latch;Skin to skin;Breast compression;Adjust position;Support pillows;Education;LC Services brochure;Hand express  Discharge Discharge Education: Engorgement and breast care;Warning signs for feeding baby Pump: DEBP;Hands Free;Personal  Consult Status Consult Status: Complete Date: 11/12/22    Omar Person, RN, IBCLC 11/12/2022, 3:48 PM

## 2022-11-12 NOTE — Progress Notes (Signed)
POSTPARTUM PROGRESS NOTE  Post Partum Day 1  Subjective:  Angel Diaz is a 27 y.o. [id number] s/p SVD at [redacted]w[redacted]d.  She reports she is doing well. No acute events overnight. She denies any problems with ambulating, voiding or po intake. Denies nausea or vomiting.  Pain is well controlled.  Lochia is = meses.  Objective: Blood pressure 110/71, pulse 73, temperature 98 F (36.7 C), temperature source Oral, resp. rate 18, height 5\' 5"  (1.651 m), weight 102.2 kg, last menstrual period 01/29/2022, SpO2 100%, unknown if currently breastfeeding.  Physical Exam:  General: alert, cooperative and no distress Chest: no respiratory distress Heart:regular rate, distal pulses intact Uterine Fundus: firm, appropriately tender DVT Evaluation: No calf swelling or tenderness Extremities: trace edema Skin: warm, dry  Recent Labs    11/10/22 2234  HGB 11.1*  HCT 34.3*    Assessment/Plan: Ja Pistole is a 27 y.o. [id number] s/p SVD at [redacted]w[redacted]d   PPD#1 - Doing well  Routine postpartum care Contraception: condoms Feeding: breast Dispo: Plan for discharge 10/16.   LOS: 2 days   Hessie Dibble, MD OB Fellow  11/12/2022, 7:13 AM

## 2022-11-13 LAB — GLUCOSE, CAPILLARY: Glucose-Capillary: 105 mg/dL — ABNORMAL HIGH (ref 70–99)

## 2022-11-14 ENCOUNTER — Encounter: Payer: BC Managed Care – PPO | Admitting: Advanced Practice Midwife

## 2022-11-14 ENCOUNTER — Other Ambulatory Visit: Payer: BC Managed Care – PPO

## 2022-11-19 ENCOUNTER — Encounter: Payer: Self-pay | Admitting: Advanced Practice Midwife

## 2022-11-19 ENCOUNTER — Encounter: Payer: Self-pay | Admitting: *Deleted

## 2022-12-04 ENCOUNTER — Telehealth (HOSPITAL_COMMUNITY): Payer: Self-pay | Admitting: *Deleted

## 2022-12-04 NOTE — Telephone Encounter (Signed)
12/04/2022  Name: Tirsa Gail MRN: [id number] DOB: [birth year]  Reason for Call:  Transition of Care Hospital Discharge Call  Contact Status: Patient Contact Status: Message  Language assistant needed:          Follow-Up Questions:    Inocente Salles Postnatal Depression Scale:  In the Past 7 Days:    PHQ2-9 Depression Scale:     Discharge Follow-up:    Post-discharge interventions: NA  Salena Saner, RN 12/04/2022 14:59

## 2022-12-17 ENCOUNTER — Encounter: Payer: Self-pay | Admitting: Women's Health

## 2022-12-17 ENCOUNTER — Ambulatory Visit (INDEPENDENT_AMBULATORY_CARE_PROVIDER_SITE_OTHER): Payer: BC Managed Care – PPO | Admitting: Women's Health

## 2022-12-17 DIAGNOSIS — Z3009 Encounter for other general counseling and advice on contraception: Secondary | ICD-10-CM

## 2022-12-17 NOTE — Patient Instructions (Signed)
Preparation H TUCKS pads  Tips To Increase Milk Supply Lots of water! Enough so that your urine is clear Plenty of calories, if you're not getting enough calories, your milk supply can decrease Breastfeed/pump often, every 2-3 hours x 20-61mins Fenugreek 3 pills 3 times a day, this may make your urine smell like maple syrup Mother's Milk Tea Lactation cookies, google for the recipe Real oatmeal Body Armor sports drinks Liquid Gold Greater Than hydration drink   Constipation Drink plenty of fluid, preferably water, throughout the day Eat foods high in fiber such as fruits, vegetables, and grains Exercise, such as walking, is a good way to keep your bowels regular Drink warm fluids, especially warm prune juice, or decaf coffee Eat a 1/2 cup of real oatmeal (not instant), 1/2 cup applesauce, and 1/2-1 cup warm prune juice every day If needed, you may take Colace (docusate sodium) stool softener once or twice a day to help keep the stool soft. If you are pregnant, wait until you are out of your first trimester (12-14 weeks of pregnancy) If you still are having problems with constipation, you may take Miralax once daily as needed to help keep your bowels regular.  If you are pregnant, wait until you are out of your first trimester (12-14 weeks of pregnancy)

## 2022-12-17 NOTE — Progress Notes (Signed)
POSTPARTUM VISIT Patient name: Angel Diaz MRN [id number]  Date of birth: December 10, 1995 Chief Complaint:   Postpartum Care  History of Present Illness:   Angel Diaz is a 27 y.o. G76P2002 Caucasian female being seen today for a postpartum visit. She is 5 weeks postpartum following a spontaneous vaginal delivery at 40.0 gestational weeks. IOL: yes, for PROM. Anesthesia: epidural.  Laceration: 1st degree.  Complications: none. Inpatient contraception: no.   Pregnancy uncomplicated. Tobacco use: no. Substance use disorder: no. Last pap smear: 05/01/22 and results were NILM w/ HRHPV negative. Next pap smear due: 2027 Patient's last menstrual period was 01/29/2022.  Postpartum course has been uncomplicated. Bleeding  occ spotting . Bowel function is  some constipation, thinks has hemorrhoid, had brb w/ bm yesterday . Bladder function is  has always had to push a little to pee, occ sharp pain-states nurse told her it was hard getting catheter in after epidural . Urinary incontinence? no, fecal incontinence? no Patient is not sexually active. Last sexual activity: prior to birth of baby. Desired contraception: condoms, declines Phexxi . Patient does want a pregnancy in the future.  Desired family size is 3 children.   The pregnancy intention screening data noted above was reviewed. Potential methods of contraception were discussed. The patient elected to proceed with No data recorded.  Edinburgh Postpartum Depression Screening: negative  Edinburgh Postnatal Depression Scale - 12/17/22 1352       Edinburgh Postnatal Depression Scale:  In the Past 7 Days   I have been able to laugh and see the funny side of things. 0    I have looked forward with enjoyment to things. 0    I have blamed myself unnecessarily when things went wrong. 1    I have been anxious or worried for no good reason. 1    I have felt scared or panicky for no good reason. 1    Things have been getting on top of me.  1    I have been so unhappy that I have had difficulty sleeping. 0    I have felt sad or miserable. 0    I have been so unhappy that I have been crying. 0    The thought of harming myself has occurred to me. 0    Edinburgh Postnatal Depression Scale Total 4                05/01/2022    2:27 PM 01/27/2020    8:54 AM 10/07/2019   11:22 AM  GAD 7 : Generalized Anxiety Score  Nervous, Anxious, on Edge 1 0 1  Control/stop worrying 0 0 0  Worry too much - different things 1 0 0  Trouble relaxing 1 1 2   Restless 0 0 0  Easily annoyed or irritable 1 1 1   Afraid - awful might happen 0 0 0  Total GAD 7 Score 4 2 4      Baby's course has been uncomplicated. Baby is feeding by breast: milk supply adequate. Infant has a pediatrician/family doctor? Yes.  Childcare strategy if returning to work/school:  cleans church on Fridays, can take baby w/ her .  Pt has material needs met for her and baby: Yes.   Review of Systems:   Pertinent items are noted in HPI Denies Abnormal vaginal discharge w/ itching/odor/irritation, headaches, visual changes, shortness of breath, chest pain, abdominal pain, severe nausea/vomiting, or problems with urination or bowel movements. Pertinent History Reviewed:  Reviewed past medical,surgical, obstetrical and family  history.  Reviewed problem list, medications and allergies. OB History  Gravida Para Term Preterm AB Living  2 2 2  0 0 2  SAB IAB Ectopic Multiple Live Births  0 0 0 0 2    # Outcome Date GA Lbr Len/2nd Weight Sex Type Anes PTL Lv  2 Term 11/11/22 [redacted]w[redacted]d 13:40 / 00:33 8 lb 3 oz (3.714 kg) F Vag-Spont EPI  LIV  1 Term 04/21/20 [redacted]w[redacted]d 08:57 / 02:45 8 lb 1.8 oz (3.68 kg) M Vag-Spont EPI  LIV     Birth Comments: None   Physical Assessment:   Vitals:   12/17/22 1349  BP: 109/73  Pulse: 79  Weight: 203 lb 8 oz (92.3 kg)  Height: 5\' 5"  (1.651 m)  Body mass index is 33.86 kg/m.       Physical Examination:   General appearance: alert, well appearing,  and in no distress  Mental status: alert, oriented to person, place, and time  Skin: warm & dry   Cardiovascular: normal heart rate noted   Respiratory: normal respiratory effort, no distress   Breasts: deferred, no complaints   Abdomen: soft, non-tender   Pelvic: lac healing well. Thin prep pap obtained: No  Rectal: small external non-thrombosed hemorrhoids  Extremities: Edema: none   Chaperone: Malachy Mood         No results found for this or any previous visit (from the past 24 hour(s)).  Assessment & Plan:  1) Postpartum exam 2) 5 wks s/p spontaneous vaginal delivery 3) breast feeding 4) Depression screening 5) Contraception counseling> plans condoms, declines Phexxi 6) Constipation> gave printed prevention/relief measures  7) Hemorrhoid> preparation H/TUCKS 8) Has always had to push to urinate, occ sharp pain> if doesn't improve let us or PCP know and will refer to urology  Essential components of care per ACOG recommendations:  1.  Mood and well being:  If positive depression screen, discussed and plan developed.  If using tobacco we discussed reduction/cessation and risk of relapse If current substance abuse, we discussed and referral to local resources was offered.   2. Infant care and feeding:  If breastfeeding, discussed returning to work, pumping, breastfeeding-associated pain, guidance regarding return to fertility while lactating if not using another method. If needed, patient was provided with a letter to be allowed to pump q 2-3hrs to support lactation in a private location with access to a refrigerator to store breastmilk.   Recommended that all caregivers be immunized for flu, pertussis and other preventable communicable diseases If pt does not have material needs met for her/baby, referred to local resources for help obtaining these.  3. Sexuality, contraception and birth spacing Provided guidance regarding sexuality, management of dyspareunia, and resumption of  intercourse Discussed avoiding interpregnancy interval <24mths and recommended birth spacing of 18 months  4. Sleep and fatigue Discussed coping options for fatigue and sleep disruption Encouraged family/partner/community support of 4 hrs of uninterrupted sleep to help with mood and fatigue  5. Physical recovery  If pt had a C/S, assessed incisional pain and providing guidance on normal vs prolonged recovery If pt had a laceration, perineal healing and pain reviewed.  If urinary or fecal incontinence, discussed management and referred to PT or uro/gyn if indicated  Patient is safe to resume physical activity. Discussed attainment of healthy weight.  6.  Chronic disease management Discussed pregnancy complications if any, and their implications for future childbearing and long-term maternal health. Review recommendations for prevention of recurrent pregnancy complications, such as 17 hydroxyprogesterone  caproate to reduce risk for recurrent PTB not applicable, or aspirin to reduce risk of preeclampsia not applicable. Pt had GDM: no. If yes, 2hr GTT scheduled: not applicable. Reviewed medications and non-pregnant dosing including consideration of whether pt is breastfeeding using a reliable resource such as LactMed: yes Referred for f/u w/ PCP or subspecialist providers as indicated: not applicable  7. Health maintenance Mammogram at 28yo or earlier if indicated Pap smears as indicated  Meds: No orders of the defined types were placed in this encounter.   Follow-up: No follow-ups on file.   No orders of the defined types were placed in this encounter.   Cheral Marker CNM, [facility] 12/17/2022 2:14 PM

## 2022-12-26 IMAGING — US US BREAST*L* LIMITED INC AXILLA
1 series · 7 of 7 positions shown · non-contrast
Comparison: None.

CLINICAL DATA: 25-year-old female with chronic pain in the LOWER
LEFT breast and discoloration of the INNER LEFT areola. The patient
has a dermatology appointment scheduled next month.

EXAM:
ULTRASOUND OF THE LEFT BREAST

[Series 1: us breast*left* limited inc axilla · 0.09mm/px · 7 of 7 slices shown]
[im 1/7]
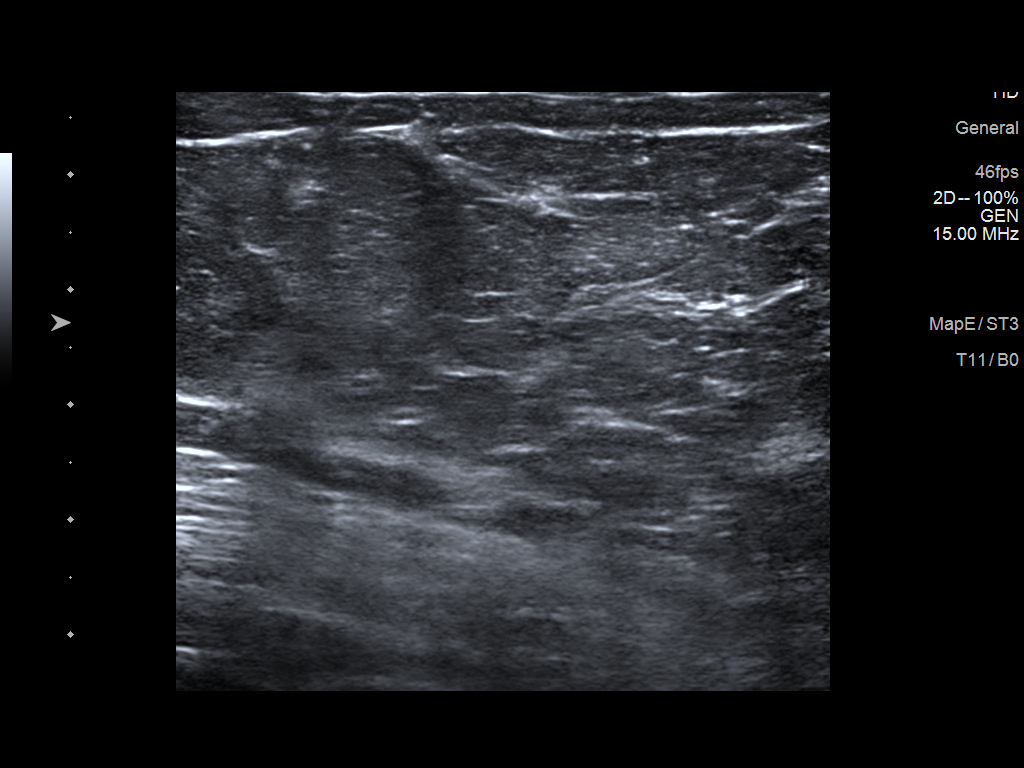
[im 2/7]
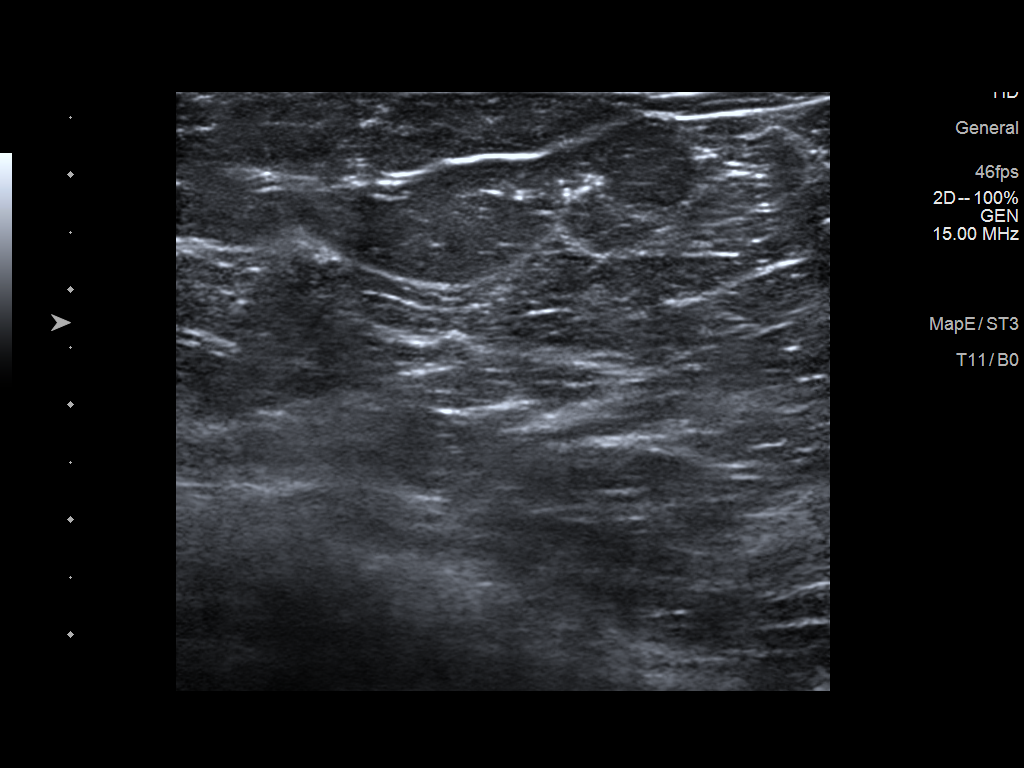
[im 3/7]
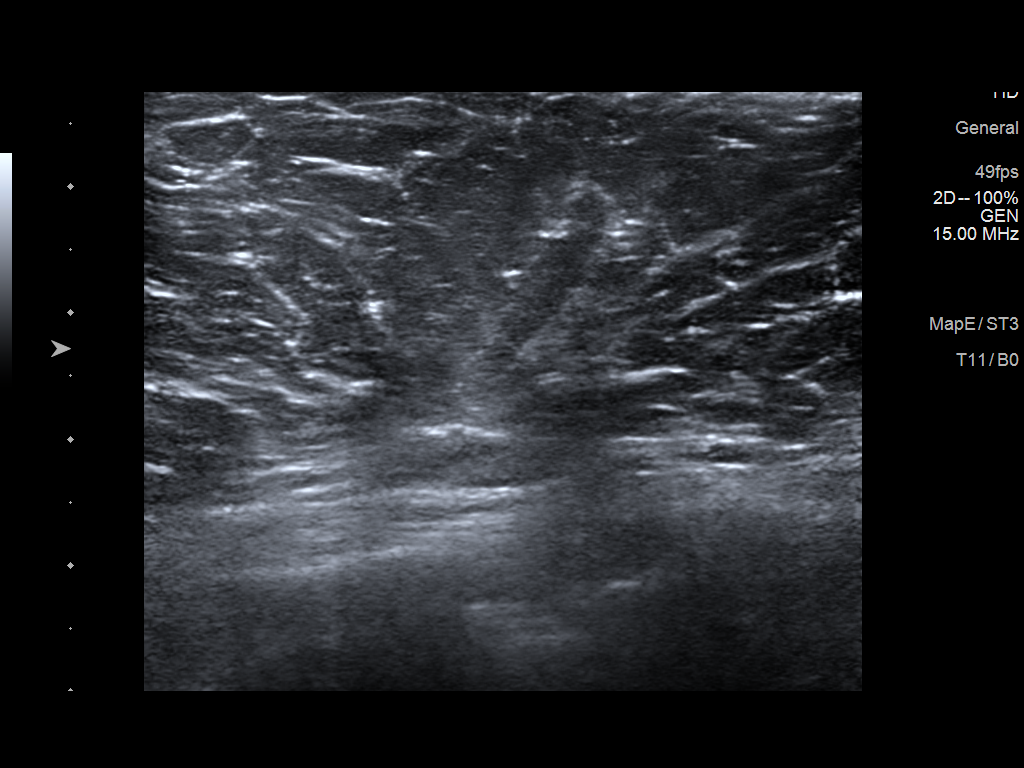
[im 4/7]
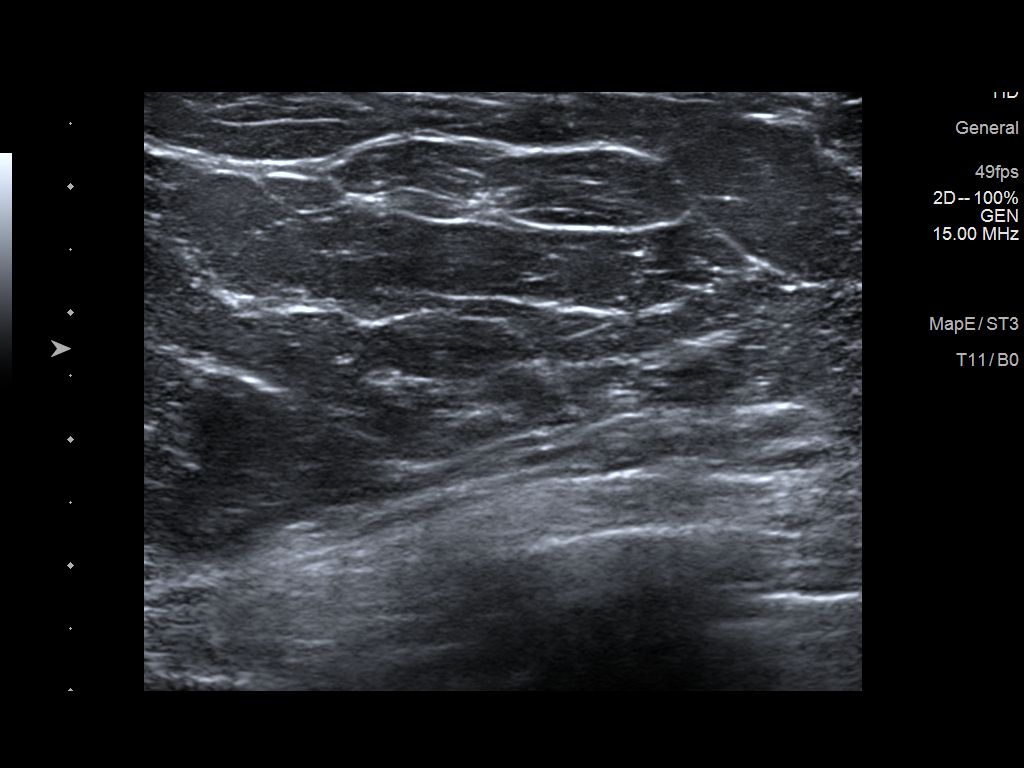
[im 5/7]
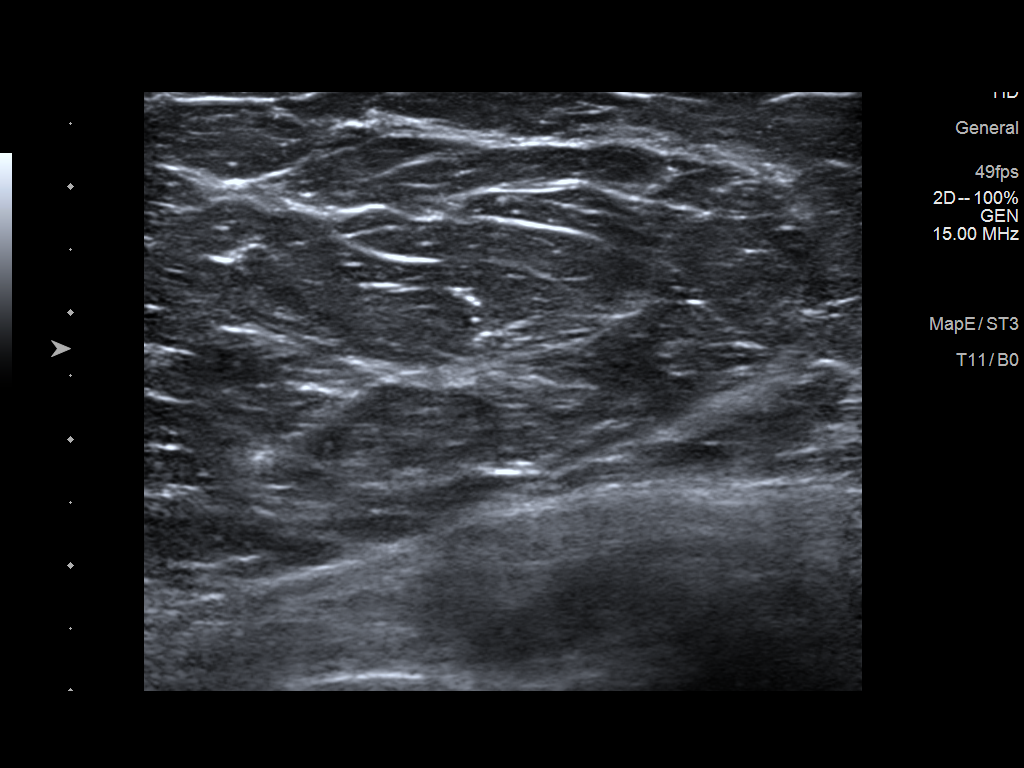
[im 6/7]
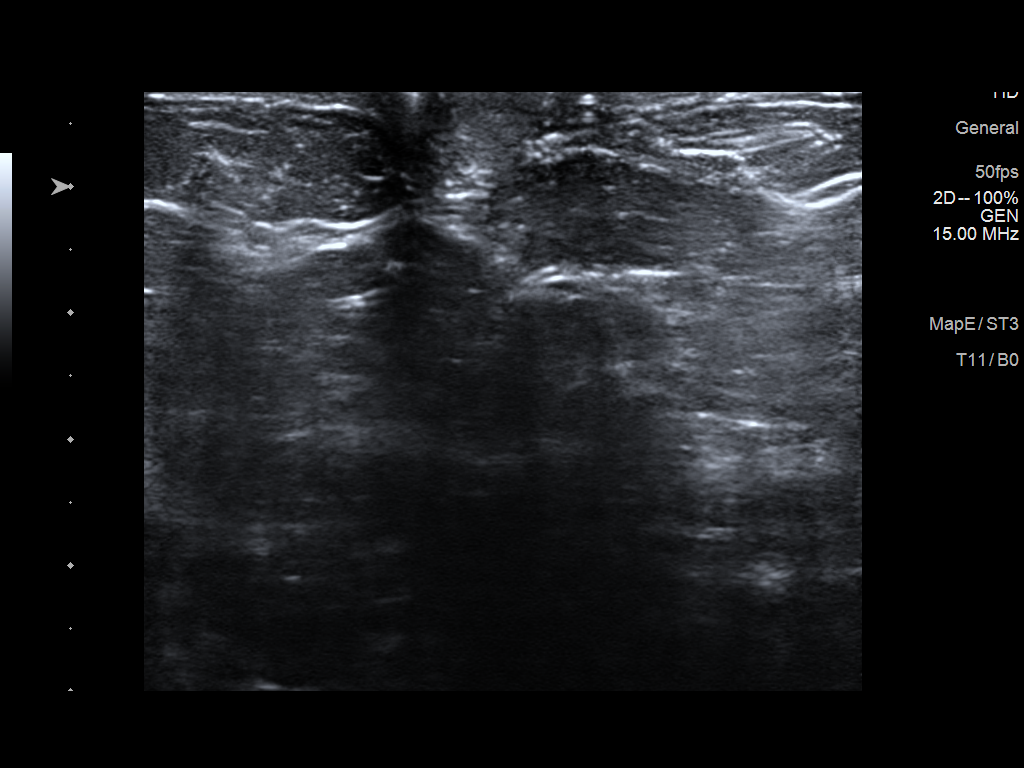
[im 7/7]
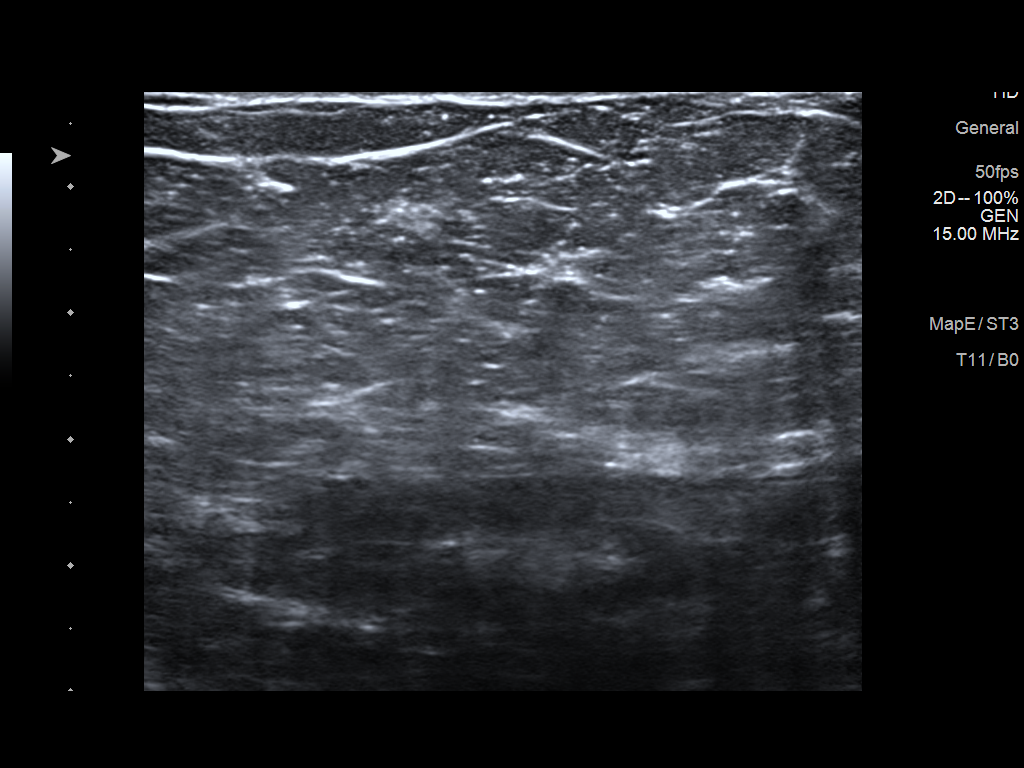

[7 of 7 positions shown; findings below may reference images not displayed]

FINDINGS: On physical exam, mild discoloration of the INNER LEFT areola is
noted without erythema, ecchymosis or scaling.

Targeted ultrasound is performed, showing no sonographic
abnormalities within the RETROAREOLAR or LOWER LEFT breast.
IMPRESSION: 1. No sonographic abnormalities RETROAREOLAR LOWER LEFT breast, in
the area of patient's pain.
2. Mild INNER LEFT areolar discoloration. Dermatologic follow-up is
recommended.

RECOMMENDATION:
Dermatology follow-up of LEFT areolar discoloration, which the
patient has a scheduled appointment next month.

Consider clinical follow-up as indicated. Any further workup should
be based on clinical grounds.

I have discussed the findings, causes and remedies of breast pain
and recommendations with the patient. If applicable, a reminder
letter will be sent to the patient regarding the next appointment.

BI-RADS CATEGORY  1: Negative.

## 2022-12-31 DIAGNOSIS — H43823 Vitreomacular adhesion, bilateral: Secondary | ICD-10-CM | POA: Diagnosis not present

## 2022-12-31 DIAGNOSIS — H35413 Lattice degeneration of retina, bilateral: Secondary | ICD-10-CM | POA: Diagnosis not present

## 2023-01-15 ENCOUNTER — Other Ambulatory Visit: Payer: Self-pay | Admitting: Medical Genetics

## 2023-01-27 ENCOUNTER — Other Ambulatory Visit (HOSPITAL_COMMUNITY)
Admission: RE | Admit: 2023-01-27 | Discharge: 2023-01-27 | Disposition: A | Discharge status: Home / Self Care | Payer: Self-pay | Source: Ambulatory Visit | Attending: Oncology | Admitting: Oncology

## 2023-02-08 LAB — GENECONNECT MOLECULAR SCREEN: Genetic Analysis Overall Interpretation: NEGATIVE

## 2023-02-27 ENCOUNTER — Ambulatory Visit: Payer: BC Managed Care – PPO | Admitting: Adult Health

## 2023-03-06 DIAGNOSIS — L308 Other specified dermatitis: Secondary | ICD-10-CM | POA: Diagnosis not present

## 2023-03-06 DIAGNOSIS — D225 Melanocytic nevi of trunk: Secondary | ICD-10-CM | POA: Diagnosis not present

## 2023-03-06 DIAGNOSIS — Z1283 Encounter for screening for malignant neoplasm of skin: Secondary | ICD-10-CM | POA: Diagnosis not present

## 2023-03-26 ENCOUNTER — Ambulatory Visit: Payer: BC Managed Care – PPO | Admitting: Adult Health

## 2023-03-26 ENCOUNTER — Encounter: Payer: Self-pay | Admitting: Adult Health

## 2023-03-26 VITALS — BP 121/73 | HR 74 | Ht 65.0 in | Wt 189.0 lb

## 2023-03-26 DIAGNOSIS — N6341 Unspecified lump in right breast, subareolar: Secondary | ICD-10-CM | POA: Diagnosis not present

## 2023-03-26 NOTE — Progress Notes (Signed)
  Subjective:     Patient ID: Angel Diaz, female   DOB: April 01, 1995, 28 y.o.   MRN: [id number]  HPI Angel Diaz is a 28 year old white female, married, G2P2002, in complaining of mass in right breast that is tender for about a month now. Gets red when pumps.     Component Value Date/Time   DIAGPAP  05/01/2022 1432    - Negative for intraepithelial lesion or malignancy (NILM)   DIAGPAP  11/10/2017 0000    NEGATIVE FOR INTRAEPITHELIAL LESIONS OR MALIGNANCY.   HPVHIGH Negative 05/01/2022 1432   ADEQPAP  05/01/2022 1432    Satisfactory for evaluation; transformation zone component PRESENT.   ADEQPAP  11/10/2017 0000    Satisfactory for evaluation  endocervical/transformation zone component PRESENT.    PCP is Dr Reuel Boom  Review of Systems Has mass right breast for about a month, it is tender, gets red when pumps Reviewed past medical,surgical, social and family history. Reviewed medications and allergies.     Objective:   Physical Exam BP 121/73 (BP Location: Left Arm, Patient Position: Sitting, Cuff Size: Normal)   Pulse 74   Ht 5\' 5"  (1.651 m)   Wt 189 lb (85.7 kg)   Breastfeeding Yes   BMI 31.45 kg/m      Skin warm and dry,  Breasts:no dominate palpable mass, retraction or nipple discharge other than milk on the left, on the right there is a pea sized mass at 12 o'clock in areola, that is tender, non mobile, no redness, no retraction +milk at nipple.  Fall risk is low  [facility] - 03/26/23 0946       Pregnancy Intention Screening   Does the patient want to become pregnant in the next year? No    Does the patient's partner want to become pregnant in the next year? No    Would the patient like to discuss contraceptive options today? No      Contraception Wrap Up   Current Method Female Condom    End Method Female Condom             Assessment:     1. Subareolar mass of right breast (Primary) Has pea sized mass at 12 o'clock in areola that is tender  for about a  month now, she is breast feeding [facility], she is losing insurance next week 04/02/23, she says  - [place] LIMITED ULTRASOUND INCLUDING AXILLA RIGHT BREAST; Future     Plan:     Follow up prn

## 2023-04-01 ENCOUNTER — Ambulatory Visit
Admission: RE | Admit: 2023-04-01 | Discharge: 2023-04-01 | Disposition: A | Discharge status: Home / Self Care | Payer: BC Managed Care – PPO | Source: Ambulatory Visit | Attending: Adult Health | Admitting: Adult Health

## 2023-04-01 DIAGNOSIS — N6310 Unspecified lump in the right breast, unspecified quadrant: Secondary | ICD-10-CM | POA: Diagnosis not present

## 2023-04-01 DIAGNOSIS — N644 Mastodynia: Secondary | ICD-10-CM | POA: Diagnosis not present

## 2023-04-01 DIAGNOSIS — N6341 Unspecified lump in right breast, subareolar: Secondary | ICD-10-CM

## 2023-04-28 ENCOUNTER — Telehealth: Admitting: Emergency Medicine

## 2023-04-28 DIAGNOSIS — N61 Mastitis without abscess: Secondary | ICD-10-CM

## 2023-04-28 MED ORDER — CEPHALEXIN 500 MG PO CAPS: 500.0000 mg | ORAL_CAPSULE | Freq: Four times a day (QID) | ORAL | 0 refills | Status: AC

## 2023-04-28 NOTE — Progress Notes (Signed)
 Virtual Visit Consent   Angel Diaz, you are scheduled for a virtual visit with a [facility] provider today. Just as with appointments in the office, your consent must be obtained to participate. Your consent will be active for this visit and any virtual visit you may have with one of our providers in the next 365 days. If you have a MyChart account, a copy of this consent can be sent to you electronically.  As this is a virtual visit, video technology does not allow for your provider to perform a traditional examination. This may limit your provider's ability to fully assess your condition. If your provider identifies any concerns that need to be evaluated in person or the need to arrange testing (such as labs, EKG, etc.), we will make arrangements to do so. Although advances in technology are sophisticated, we cannot ensure that it will always work on either your end or our end. If the connection with a video visit is poor, the visit may have to be switched to a telephone visit. With either a video or telephone visit, we are not always able to ensure that we have a secure connection.  By engaging in this virtual visit, you consent to the provision of healthcare and authorize for your insurance to be billed (if applicable) for the services provided during this visit. Depending on your insurance coverage, you may receive a charge related to this service.  I need to obtain your verbal consent now. Are you willing to proceed with your visit today? Angel Diaz has provided verbal consent on 04/28/2023 for a virtual visit (video or telephone). Angel Parsons, NP  Date: 04/28/2023 9:46 AM   Virtual Visit via Video Note   I, Angel Diaz, connected with  Angel Diaz  ([id number], 1995-10-13) on 04/28/23 at  9:15 AM EDT by a video-enabled telemedicine application and verified that I am speaking with the correct person using two identifiers.  Location: Patient: Virtual  Visit Location Patient: Home Provider: Virtual Visit Location Provider: Home Office   I discussed the limitations of evaluation and management by telemedicine and the availability of in person appointments. The patient expressed understanding and agreed to proceed.    History of Present Illness: Angel Diaz is a 28 y.o. who identifies as a female who was assigned female at birth, and is being seen today for concern for mastitis. Has L breast pain since last night; today top of breast is red and warm. Pt's infant daughter is exclusively breastfed and is picky about where she eats; patient was out of the house most of this weekend and didn't adhere to her usual pumping and breastfeeding routine. Using ibuprofen today and ice. Is using pumping and today fully emptied her breasts but she still has the same L breast sx. Has had mastitis before and believes she is in the early stages of getting it again.   HPI: HPI  Problems:  Patient Active Problem List   Diagnosis Date Noted   Subareolar mass of right breast 03/26/2023    Allergies:  Allergies  Allergen Reactions   Sulfa Antibiotics Rash   Medications:  Current Outpatient Medications:    cephALEXin (KEFLEX) 500 MG capsule, Take 1 capsule (500 mg total) by mouth 4 (four) times daily for 7 days., Disp: 28 capsule, Rfl: 0   FIBER PO, Take by mouth. Gummy-daily, Disp: , Rfl:    ibuprofen (ADVIL) 600 MG tablet, Take 1 tablet (600 mg total) by mouth every  6 (six) hours. (Patient taking differently: Take 600 mg by mouth.), Disp: 30 tablet, Rfl: 0   Prenatal Vit-Fe Fumarate-FA (MULTIVITAMIN-PRENATAL) 27-0.8 MG TABS tablet, Take 1 tablet by mouth daily at 12 noon., Disp: , Rfl:    Probiotic Product (PROBIOTIC PO), Take by mouth., Disp: , Rfl:   Observations/Objective: Patient is well-developed, well-nourished in no acute distress.  Resting comfortably  at home.  Head is normocephalic, atraumatic.  No labored breathing.  Speech is clear  and coherent with logical content.  Patient is alert and oriented at baseline.    Assessment and Plan: 1. Mastitis (Primary)  Probable mastitis. Pt wants rx for antibiotics in case conservative measures (ice, pain med) at home aren't sufficient.   Follow Up Instructions: I discussed the assessment and treatment plan with the patient. The patient was provided an opportunity to ask questions and all were answered. The patient agreed with the plan and demonstrated an understanding of the instructions.  A copy of instructions were sent to the patient via MyChart unless otherwise noted below.  The patient was advised to call back or seek an in-person evaluation if the symptoms worsen or if the condition fails to improve as anticipated.    Angel Parsons, NP

## 2023-04-28 NOTE — Patient Instructions (Signed)
  [name], thank you for joining Cathlyn Parsons, NP for today's virtual visit.  While this provider is not your primary care provider (PCP), if your PCP is located in our provider database this encounter information will be shared with them immediately following your visit.   A [facility] MyChart account gives you access to today's visit and all your visits, tests, and labs performed at [facility] " click here if you don't have a [facility] MyChart account or go to [email or web address]  Consent: (Patient) Angel Diaz provided verbal consent for this virtual visit at the beginning of the encounter.  Current Medications:  Current Outpatient Medications:    cephALEXin (KEFLEX) 500 MG capsule, Take 1 capsule (500 mg total) by mouth 4 (four) times daily for 7 days., Disp: 28 capsule, Rfl: 0   FIBER PO, Take by mouth. Gummy-daily, Disp: , Rfl:    ibuprofen (ADVIL) 600 MG tablet, Take 1 tablet (600 mg total) by mouth every 6 (six) hours. (Patient taking differently: Take 600 mg by mouth.), Disp: 30 tablet, Rfl: 0   Prenatal Vit-Fe Fumarate-FA (MULTIVITAMIN-PRENATAL) 27-0.8 MG TABS tablet, Take 1 tablet by mouth daily at 12 noon., Disp: , Rfl:    Probiotic Product (PROBIOTIC PO), Take by mouth., Disp: , Rfl:    Medications ordered in this encounter:  Meds ordered this encounter  Medications   cephALEXin (KEFLEX) 500 MG capsule    Sig: Take 1 capsule (500 mg total) by mouth 4 (four) times daily for 7 days.    Dispense:  28 capsule    Refill:  0     *If you need refills on other medications prior to your next appointment, please contact your pharmacy*  Follow-Up: Call back or seek an in-person evaluation if the symptoms worsen or if the condition fails to improve as anticipated.  [facility] [phone]    If you have been instructed to have an in-person evaluation today at a local Urgent Care facility, please use the link below.  It will take you to a list of all of our available [facility], including address, phone number and hours of operation. Please do not delay care.  [facility]  If you or a family member do not have a primary care provider, use the link below to schedule a visit and establish care. When you choose a [facility] primary care physician or advanced practice provider, you gain a long-term partner in health. Find a Primary Care Provider  Learn more about [facility]'s in-office and virtual care options: [facility] - Get Care Now
